# Patient Record
Sex: Female | Born: 1955 | Race: White | Hispanic: No
Health system: Southern US, Community
[De-identification: ages and names within clinical notes are randomized; demographics above are authoritative.]

---

## 2020-09-22 ENCOUNTER — Inpatient Hospital Stay
Admission: EM | Admit: 2020-09-22 | Discharge: 2020-11-06 | Disposition: E | Payer: Medicare Other | Source: Other Acute Inpatient Hospital | Attending: Internal Medicine | Admitting: Internal Medicine

## 2020-09-22 ENCOUNTER — Ambulatory Visit (HOSPITAL_COMMUNITY)
Admission: AD | Admit: 2020-09-22 | Discharge: 2020-09-22 | Disposition: A | Discharge status: Home / Self Care | Payer: Medicare Other | Source: Other Acute Inpatient Hospital | Attending: Internal Medicine | Admitting: Internal Medicine

## 2020-09-22 DIAGNOSIS — R0603 Acute respiratory distress: Secondary | ICD-10-CM

## 2020-09-22 DIAGNOSIS — K5641 Fecal impaction: Secondary | ICD-10-CM

## 2020-09-22 DIAGNOSIS — J690 Pneumonitis due to inhalation of food and vomit: Secondary | ICD-10-CM

## 2020-09-22 DIAGNOSIS — J9621 Acute and chronic respiratory failure with hypoxia: Secondary | ICD-10-CM | POA: Insufficient documentation

## 2020-09-22 DIAGNOSIS — Z4659 Encounter for fitting and adjustment of other gastrointestinal appliance and device: Secondary | ICD-10-CM

## 2020-09-22 DIAGNOSIS — J189 Pneumonia, unspecified organism: Secondary | ICD-10-CM | POA: Insufficient documentation

## 2020-09-22 DIAGNOSIS — K567 Ileus, unspecified: Secondary | ICD-10-CM

## 2020-09-22 DIAGNOSIS — J449 Chronic obstructive pulmonary disease, unspecified: Secondary | ICD-10-CM

## 2020-09-22 DIAGNOSIS — U071 COVID-19: Secondary | ICD-10-CM

## 2020-09-22 DIAGNOSIS — G4733 Obstructive sleep apnea (adult) (pediatric): Secondary | ICD-10-CM

## 2020-09-22 DIAGNOSIS — I482 Chronic atrial fibrillation, unspecified: Secondary | ICD-10-CM

## 2020-09-22 DIAGNOSIS — J90 Pleural effusion, not elsewhere classified: Secondary | ICD-10-CM

## 2020-09-22 DIAGNOSIS — K5900 Constipation, unspecified: Secondary | ICD-10-CM

## 2020-09-22 DIAGNOSIS — Z452 Encounter for adjustment and management of vascular access device: Secondary | ICD-10-CM

## 2020-09-22 LAB — BLOOD GAS, ARTERIAL
Acid-Base Excess: 0.2 mmol/L (ref 0.0–2.0)
Bicarbonate: 24.8 mmol/L (ref 20.0–28.0)
Drawn by: 164
FIO2: 75
O2 Saturation: 94.3 %
Patient temperature: 37
pCO2 arterial: 44.2 mmHg (ref 32.0–48.0)
pH, Arterial: 7.369 (ref 7.350–7.450)
pO2, Arterial: 76.3 mmHg — ABNORMAL LOW (ref 83.0–108.0)

## 2020-09-23 ENCOUNTER — Other Ambulatory Visit (HOSPITAL_COMMUNITY): Payer: Medicare Other

## 2020-09-23 LAB — CBC
HCT: 28.7 % — ABNORMAL LOW (ref 36.0–46.0)
Hemoglobin: 8 g/dL — ABNORMAL LOW (ref 12.0–15.0)
MCH: 24.5 pg — ABNORMAL LOW (ref 26.0–34.0)
MCHC: 27.9 g/dL — ABNORMAL LOW (ref 30.0–36.0)
MCV: 88 fL (ref 80.0–100.0)
Platelets: 177 10*3/uL (ref 150–400)
RBC: 3.26 MIL/uL — ABNORMAL LOW (ref 3.87–5.11)
RDW: 20.5 % — ABNORMAL HIGH (ref 11.5–15.5)
WBC: 17 10*3/uL — ABNORMAL HIGH (ref 4.0–10.5)
nRBC: 0.6 % — ABNORMAL HIGH (ref 0.0–0.2)

## 2020-09-23 LAB — COMPREHENSIVE METABOLIC PANEL
ALT: 11 U/L (ref 0–44)
AST: 14 U/L — ABNORMAL LOW (ref 15–41)
Albumin: 1.5 g/dL — ABNORMAL LOW (ref 3.5–5.0)
Alkaline Phosphatase: 66 U/L (ref 38–126)
Anion gap: 10 (ref 5–15)
BUN: 24 mg/dL — ABNORMAL HIGH (ref 8–23)
CO2: 25 mmol/L (ref 22–32)
Calcium: 7.8 mg/dL — ABNORMAL LOW (ref 8.9–10.3)
Chloride: 97 mmol/L — ABNORMAL LOW (ref 98–111)
Creatinine, Ser: 0.86 mg/dL (ref 0.44–1.00)
GFR, Estimated: >60 mL/min (ref >60)
Glucose, Bld: 115 mg/dL — ABNORMAL HIGH (ref 70–99)
Potassium: 3.6 mmol/L (ref 3.5–5.1)
Sodium: 132 mmol/L — ABNORMAL LOW (ref 135–145)
Total Bilirubin: 1 mg/dL (ref 0.3–1.2)
Total Protein: 4.4 g/dL — ABNORMAL LOW (ref 6.5–8.1)

## 2020-09-23 LAB — MAGNESIUM: Magnesium: 1.8 mg/dL (ref 1.7–2.4)

## 2020-09-23 LAB — PROTIME-INR
INR: 2 — ABNORMAL HIGH (ref 0.8–1.2)
Prothrombin Time: 23 seconds — ABNORMAL HIGH (ref 11.4–15.2)

## 2020-09-23 IMAGING — DX DG CHEST 1V PORT
1 series · 1 of 1 positions shown · non-contrast
Comparison: None.

CLINICAL DATA: Patient is suffering from pneumonia.

EXAM:
PORTABLE CHEST 1 VIEW

[chest]
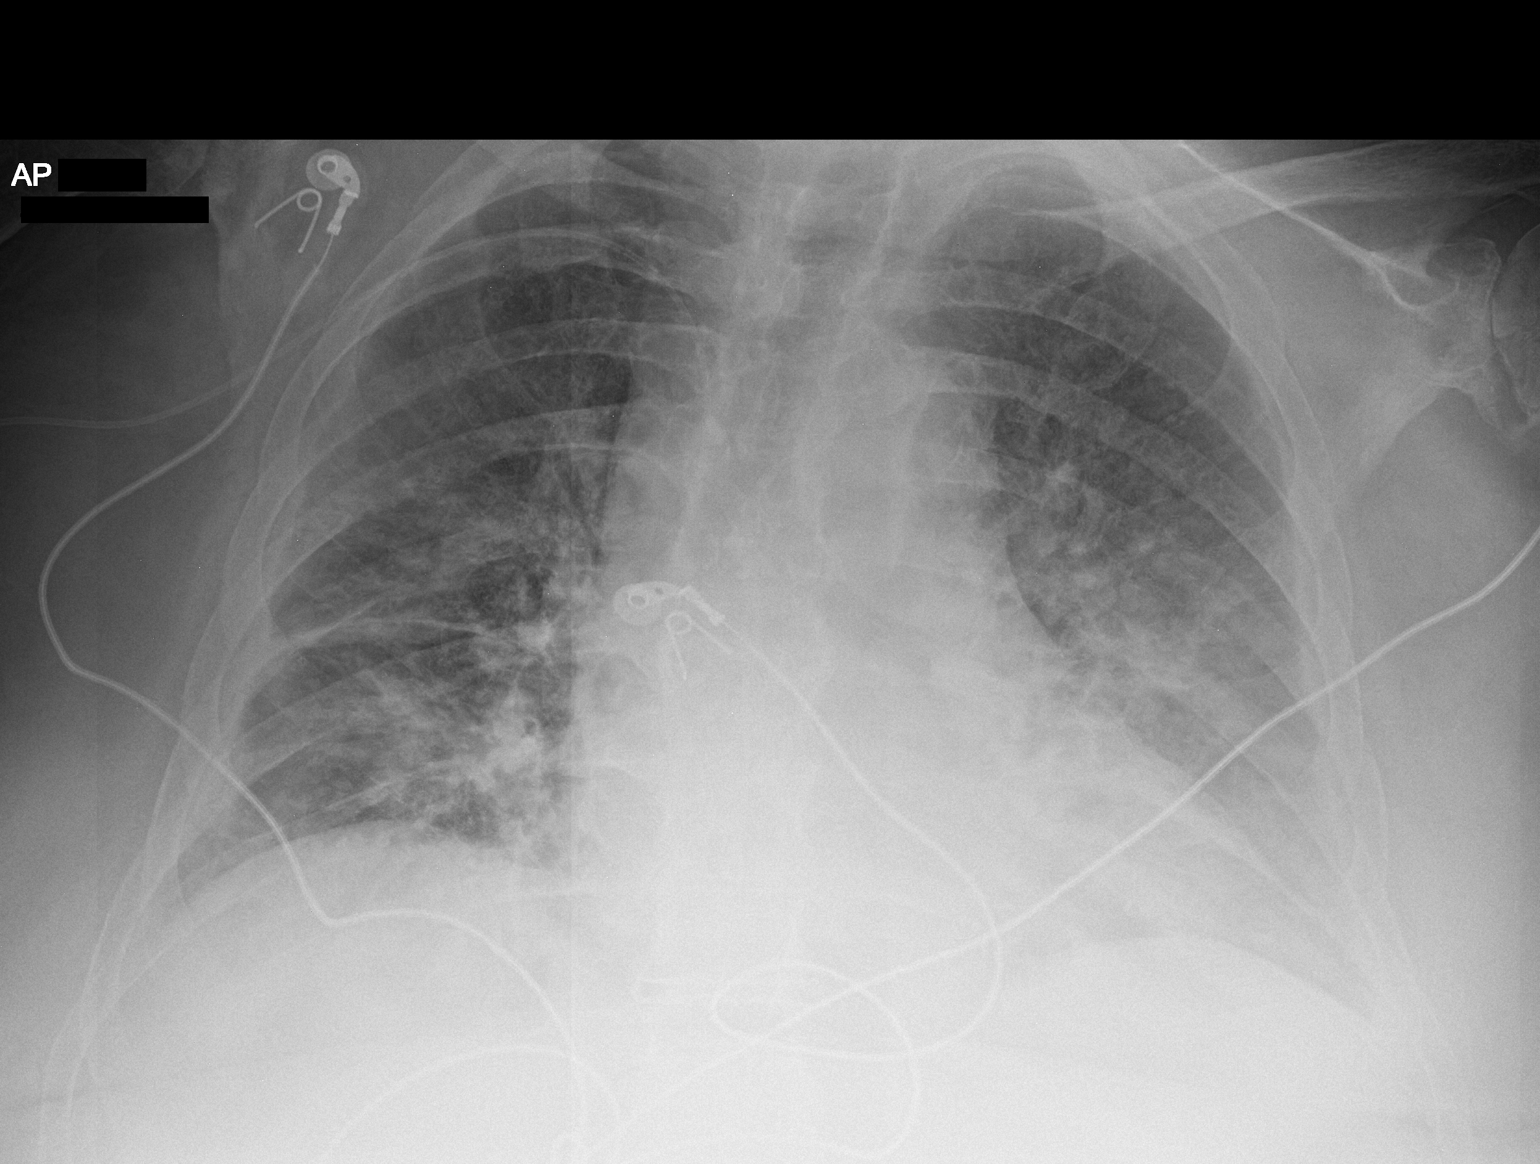

[1 of 1 positions shown; findings below may reference images not displayed]

FINDINGS: A right PICC line terminates in the SVC. Bilateral pulmonary
infiltrates suggestive of pneumonia given history. Probable mild
cardiomegaly. The hila and mediastinum are unremarkable. No other
acute abnormalities. No pneumothorax.
IMPRESSION: Bilateral pulmonary infiltrates most consistent with pneumonia given
history. Recommend short-term follow-up imaging to ensure
resolution.

## 2020-09-23 IMAGING — DX DG ABDOMEN 1V
1 series · 2 of 2 positions shown · non-contrast
Comparison: None

CLINICAL DATA: Evaluate for ileus

EXAM:
ABDOMEN - 1 VIEW

[Series 1: abdomen · 0.14mm/px · 2 of 2 slices shown]
[im 1/2]
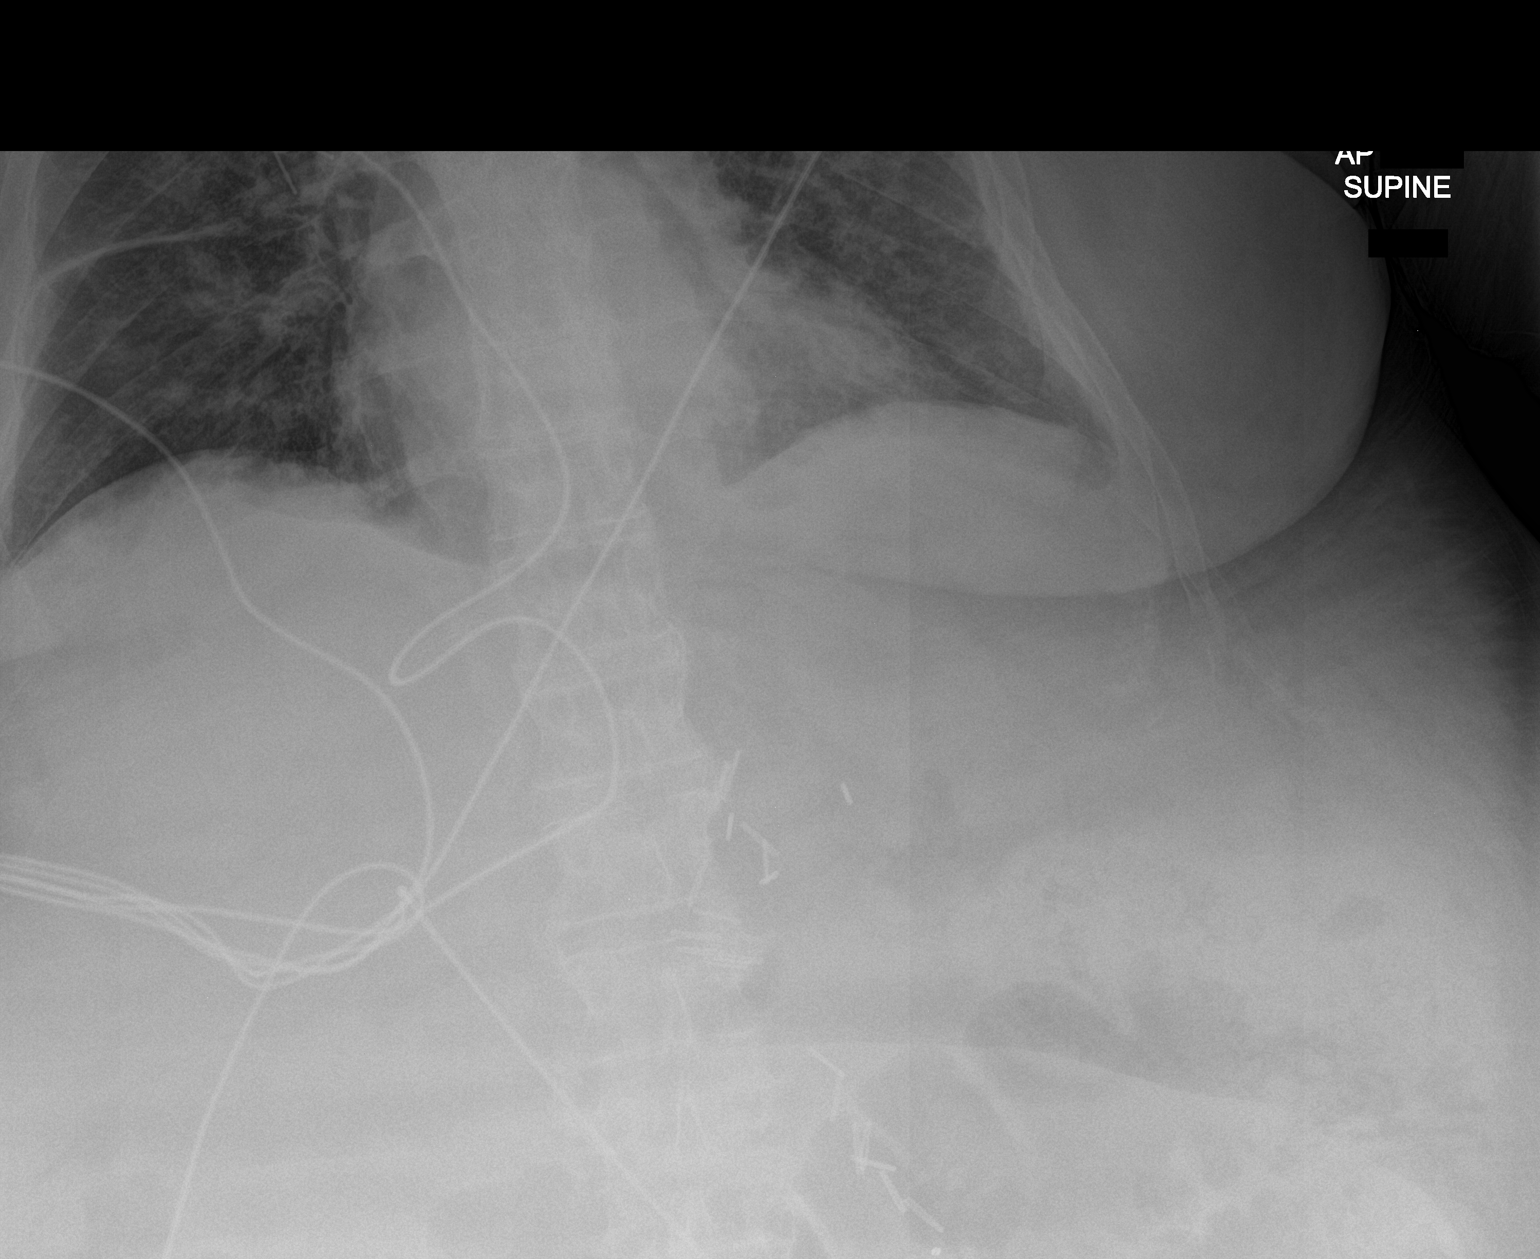
[im 2/2]
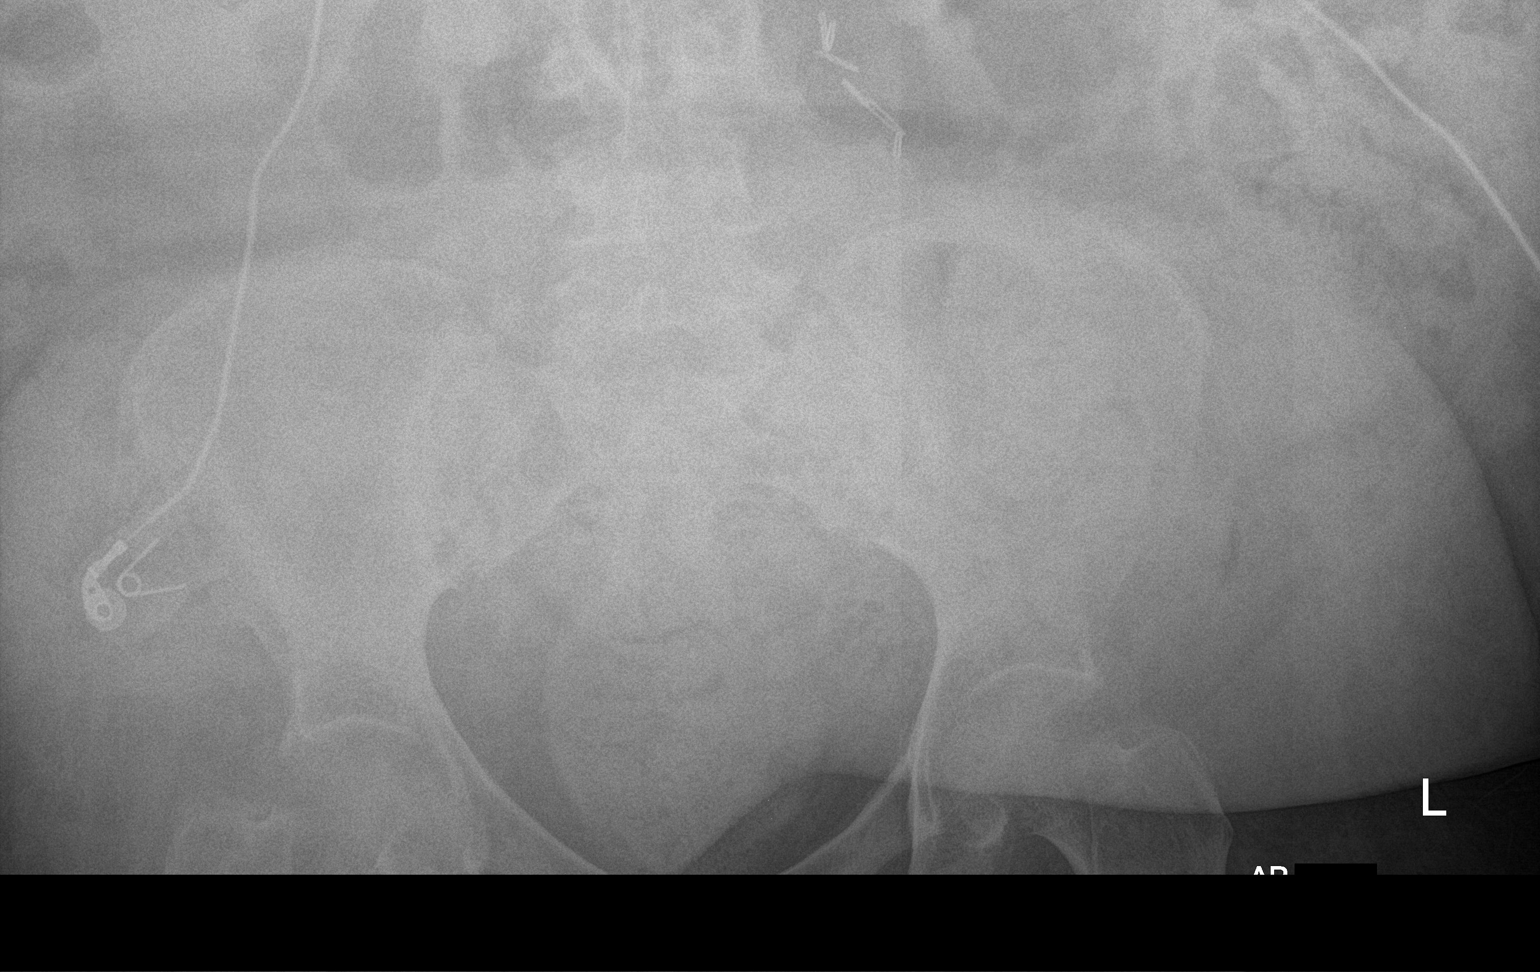

[2 of 2 positions shown; findings below may reference images not displayed]

FINDINGS: Mild fecal loading in the left colon. No evidence of ileus. No other
abnormalities in the abdomen.
IMPRESSION: Mild fecal loading.  No evidence of ileus or obstruction.

## 2020-09-24 DIAGNOSIS — U071 COVID-19: Secondary | ICD-10-CM | POA: Diagnosis not present

## 2020-09-24 DIAGNOSIS — G4733 Obstructive sleep apnea (adult) (pediatric): Secondary | ICD-10-CM

## 2020-09-24 DIAGNOSIS — I482 Chronic atrial fibrillation, unspecified: Secondary | ICD-10-CM

## 2020-09-24 DIAGNOSIS — J9621 Acute and chronic respiratory failure with hypoxia: Secondary | ICD-10-CM

## 2020-09-24 DIAGNOSIS — J449 Chronic obstructive pulmonary disease, unspecified: Secondary | ICD-10-CM

## 2020-09-24 LAB — BLOOD GAS, ARTERIAL
Acid-base deficit: 0.8 mmol/L (ref 0.0–2.0)
Bicarbonate: 23.5 mmol/L (ref 20.0–28.0)
FIO2: 100
O2 Saturation: 96.5 %
Patient temperature: 37
pCO2 arterial: 40.1 mmHg (ref 32.0–48.0)
pH, Arterial: 7.386 (ref 7.350–7.450)
pO2, Arterial: 183 mmHg — ABNORMAL HIGH (ref 83.0–108.0)

## 2020-09-24 LAB — URINE CULTURE: Culture: NO GROWTH

## 2020-09-24 LAB — URINALYSIS, ROUTINE W REFLEX MICROSCOPIC
Glucose, UA: NEGATIVE mg/dL
Hgb urine dipstick: NEGATIVE
Ketones, ur: NEGATIVE mg/dL
Leukocytes,Ua: NEGATIVE
Nitrite: NEGATIVE
Protein, ur: NEGATIVE mg/dL
Specific Gravity, Urine: 1.01 (ref 1.005–1.030)
pH: 5.5 (ref 5.0–8.0)

## 2020-09-24 LAB — CBC
HCT: 30 % — ABNORMAL LOW (ref 36.0–46.0)
Hemoglobin: 8.4 g/dL — ABNORMAL LOW (ref 12.0–15.0)
MCH: 24.6 pg — ABNORMAL LOW (ref 26.0–34.0)
MCHC: 28 g/dL — ABNORMAL LOW (ref 30.0–36.0)
MCV: 88 fL (ref 80.0–100.0)
Platelets: 195 10*3/uL (ref 150–400)
RBC: 3.41 MIL/uL — ABNORMAL LOW (ref 3.87–5.11)
RDW: 20.9 % — ABNORMAL HIGH (ref 11.5–15.5)
WBC: 16.5 10*3/uL — ABNORMAL HIGH (ref 4.0–10.5)
nRBC: 0.7 % — ABNORMAL HIGH (ref 0.0–0.2)

## 2020-09-24 LAB — BASIC METABOLIC PANEL
Anion gap: 11 (ref 5–15)
BUN: 24 mg/dL — ABNORMAL HIGH (ref 8–23)
CO2: 23 mmol/L (ref 22–32)
Calcium: 7.7 mg/dL — ABNORMAL LOW (ref 8.9–10.3)
Chloride: 99 mmol/L (ref 98–111)
Creatinine, Ser: 0.74 mg/dL (ref 0.44–1.00)
GFR, Estimated: >60 mL/min (ref >60)
Glucose, Bld: 127 mg/dL — ABNORMAL HIGH (ref 70–99)
Potassium: 3.5 mmol/L (ref 3.5–5.1)
Sodium: 133 mmol/L — ABNORMAL LOW (ref 135–145)

## 2020-09-24 LAB — PHOSPHORUS: Phosphorus: 3.2 mg/dL (ref 2.5–4.6)

## 2020-09-24 LAB — TSH: TSH: 4.434 u[IU]/mL (ref 0.350–4.500)

## 2020-09-24 LAB — HEMOGLOBIN A1C
Hgb A1c MFr Bld: 6.5 % — ABNORMAL HIGH (ref 4.8–5.6)
Mean Plasma Glucose: 139.85 mg/dL

## 2020-09-24 LAB — MAGNESIUM: Magnesium: 1.5 mg/dL — ABNORMAL LOW (ref 1.7–2.4)

## 2020-09-24 NOTE — Consult Note (Signed)
Pulmonary Critical Care Medicine [facility] [place]  PULMONARY SERVICE  Date of Service: 09/24/2020  PULMONARY CRITICAL CARE CONSULT   Angelica Sweeney  QPY:[id number]  DOB: 11/11/1955   DOA: 10/05/2020  Referring Physician: Luna Kitchens, MD  HPI: Angelica Sweeney is a 65 y.o. female seen for follow up of Acute on Chronic Respiratory Failure.  Patient has multiple medical problems including chronic atrial fibrillation COPD CHF diabetes mellitus nephrectomy melanoma came into the hospital because of increasing shortness of breath.  At the transferring facility patient is presented with shortness of breath patient was found to have COVID-19 with diffuse disease.  Patient was given remdesivir as well as steroids with some improvement however oxygen requirements did not improve.  On presentation here patient was on high flow oxygen 15L still with significant changes on the x-rays.  Patient is here for further management weaning of oxygen.  Review of Systems:  ROS performed and is unremarkable other than noted above.  Past medical history: COPD Atrial fibrillation Morbid obesity CHF Diabetes Renal cell cancer   Past surgical history: Nephrectomy  Social history: Unknown tobacco alcohol drug abuse  Family History: Non-Contributory to the present illness  Not on File  Medications: Reviewed on Rounds  Physical Exam:  Vitals: Temperature is 98.5 pulse 81 respiratory rate is 28 blood pressure 147/58 saturations 95%  Ventilator Settings on OptiFlow 60 L  General: Comfortable at this time Eyes: Grossly normal lids, irises & conjunctiva ENT: grossly tongue is normal Neck: no obvious mass Cardiovascular: S1-S2 normal no gallop or rub Respiratory: Coarse rhonchi are noted bilaterally Abdomen: Morbidly obese soft Skin: no rash seen on limited exam Musculoskeletal: not rigid Psychiatric:unable to assess Neurologic: no seizure no involuntary movements         Labs  on Admission:  Basic Metabolic Panel: Recent Labs  Lab 09/23/20 0410 09/24/20 0734  NA 132* 133*  K 3.6 3.5  CL 97* 99  CO2 25 23  GLUCOSE 115* 127*  BUN 24* 24*  CREATININE 0.86 0.74  CALCIUM 7.8* 7.7*  MG 1.8 1.5*  PHOS  --  3.2    Recent Labs  Lab 09/21/2020 1805 09/24/20 1500  PHART 7.369 7.386  PCO2ART 44.2 40.1  PO2ART 76.3* 183*  HCO3 24.8 23.5  O2SAT 94.3 96.5    Liver Function Tests: Recent Labs  Lab 09/23/20 0410  AST 14*  ALT 11  ALKPHOS 66  BILITOT 1.0  PROT 4.4*  ALBUMIN 1.5*   No results for input(s): LIPASE, AMYLASE in the last 168 hours. No results for input(s): AMMONIA in the last 168 hours.  CBC: Recent Labs  Lab 09/23/20 0410 09/24/20 0734  WBC 17.0* 16.5*  HGB 8.0* 8.4*  HCT 28.7* 30.0*  MCV 88.0 88.0  PLT 177 195    Cardiac Enzymes: No results for input(s): CKTOTAL, CKMB, CKMBINDEX, TROPONINI in the last 168 hours.  BNP (last 3 results) No results for input(s): BNP in the last 8760 hours.  ProBNP (last 3 results) No results for input(s): PROBNP in the last 8760 hours.   Radiological Exams on Admission: DG Abd 1 View  Result Date: 09/23/2020 CLINICAL DATA:  Evaluate for ileus EXAM: ABDOMEN - 1 VIEW COMPARISON:  None FINDINGS: Mild fecal loading in the left colon. No evidence of ileus. No other abnormalities in the abdomen. IMPRESSION: Mild fecal loading.  No evidence of ileus or obstruction. Electronically Signed   By: Gerome Sam III M.D.   On: 09/23/2020 11:58   DG Chest [facility]  1 View  Result Date: 09/23/2020 CLINICAL DATA:  Patient is suffering from pneumonia. EXAM: PORTABLE CHEST 1 VIEW COMPARISON:  None. FINDINGS: A right PICC line terminates in the SVC. Bilateral pulmonary infiltrates suggestive of pneumonia given history. Probable mild cardiomegaly. The hila and mediastinum are unremarkable. No other acute abnormalities. No pneumothorax. IMPRESSION: Bilateral pulmonary infiltrates most consistent with pneumonia given  history. Recommend short-term follow-up imaging to ensure resolution. Electronically Signed   By: Gerome Sam III M.D.   On: 09/23/2020 11:57    Assessment/Plan Active Problems:   Acute on chronic respiratory failure with hypoxia (HCC)   COPD, severe (HCC)   COVID-19 virus infection   Atrial fibrillation, chronic (HCC)   Obstructive sleep apnea   Acute on chronic respiratory failure with hypoxia patient is going to be continued on OptiFlow we will try to wean FiO2 down as tolerated continue with secretion management pulmonary toilet. COVID-19 virus infection in recovery phase we will continue to monitor along closely.  Chest x-ray still showing pulmonary infiltrates and patient still remains significantly hypoxic Chronic atrial fibrillation rate now rate is controlled we will continue with supportive care Severe COPD we will continue with medical management nebulizers as deemed necessary Obstructive sleep apnea patient stated that she had OSA but has not been compliant with CPAP and she states she does not have a machine  I have personally seen and evaluated the patient, evaluated laboratory and imaging results, formulated the assessment and plan and placed orders. The Patient requires high complexity decision making with multiple systems involvement.  Case was discussed on Rounds with the Respiratory Therapy Director and the Respiratory staff Time Spent  Yevonne Pax, MD [facility] Pulmonary Critical Care Medicine Sleep Medicine

## 2020-09-25 DIAGNOSIS — J449 Chronic obstructive pulmonary disease, unspecified: Secondary | ICD-10-CM | POA: Diagnosis not present

## 2020-09-25 DIAGNOSIS — J9621 Acute and chronic respiratory failure with hypoxia: Secondary | ICD-10-CM | POA: Diagnosis not present

## 2020-09-25 DIAGNOSIS — I482 Chronic atrial fibrillation, unspecified: Secondary | ICD-10-CM | POA: Diagnosis not present

## 2020-09-25 DIAGNOSIS — U071 COVID-19: Secondary | ICD-10-CM

## 2020-09-25 DIAGNOSIS — G4733 Obstructive sleep apnea (adult) (pediatric): Secondary | ICD-10-CM

## 2020-09-25 LAB — VANCOMYCIN, TROUGH
Vancomycin Tr: 33 ug/mL (ref 15–20)
Vancomycin Tr: 39 ug/mL (ref 15–20)

## 2020-09-25 LAB — MAGNESIUM: Magnesium: 2 mg/dL (ref 1.7–2.4)

## 2020-09-25 NOTE — Progress Notes (Signed)
Chart opened in error

## 2020-09-25 NOTE — Progress Notes (Signed)
Pulmonary Critical Care Medicine [facility] [place]   PULMONARY CRITICAL CARE SERVICE  PROGRESS NOTE     Angelica Sweeney  RKY:[id number]  DOB: 02/04/1955   DOA: 09/11/2020  Referring Physician: Luna Kitchens, MD  HPI: Angelica Sweeney is a 65 y.o. female being followed for ventilator/airway/oxygen weaning Acute on Chronic Respiratory Failure.  At this time patient is on 40 L high flow with 45% FiO2 he was refusing the BiPAP which I expected she would however once again she has been encouraged to try to be more compliant with recommended therapies.  Medications: Reviewed on Rounds  Physical Exam:  Vitals: Temperature is 98.3 pulse 93 respiratory rate was 28 blood pressure is 127/65 saturations 100%  Ventilator Settings patient remains on high flow oxygen 40 L with 45% FiO2  General: Comfortable at this time Neck: supple Cardiovascular: no malignant arrhythmias Respiratory: Coarse rhonchi expansion is equal Skin: no rash seen on limited exam Musculoskeletal: No gross abnormality Psychiatric:unable to assess Neurologic:no involuntary movements         Lab Data:   Basic Metabolic Panel: Recent Labs  Lab 09/23/20 0410 09/24/20 0734 09/25/20 0015  NA 132* 133*  --   K 3.6 3.5  --   CL 97* 99  --   CO2 25 23  --   GLUCOSE 115* 127*  --   BUN 24* 24*  --   CREATININE 0.86 0.74  --   CALCIUM 7.8* 7.7*  --   MG 1.8 1.5* 2.0  PHOS  --  3.2  --     ABG: Recent Labs  Lab 09/21/2020 1805 09/24/20 1500  PHART 7.369 7.386  PCO2ART 44.2 40.1  PO2ART 76.3* 183*  HCO3 24.8 23.5  O2SAT 94.3 96.5    Liver Function Tests: Recent Labs  Lab 09/23/20 0410  AST 14*  ALT 11  ALKPHOS 66  BILITOT 1.0  PROT 4.4*  ALBUMIN 1.5*   No results for input(s): LIPASE, AMYLASE in the last 168 hours. No results for input(s): AMMONIA in the last 168 hours.  CBC: Recent Labs  Lab 09/23/20 0410 09/24/20 0734  WBC 17.0* 16.5*  HGB 8.0* 8.4*  HCT 28.7* 30.0*  MCV  88.0 88.0  PLT 177 195    Cardiac Enzymes: No results for input(s): CKTOTAL, CKMB, CKMBINDEX, TROPONINI in the last 168 hours.  BNP (last 3 results) No results for input(s): BNP in the last 8760 hours.  ProBNP (last 3 results) No results for input(s): PROBNP in the last 8760 hours.  Radiological Exams: DG Abd 1 View  Result Date: 09/23/2020 CLINICAL DATA:  Evaluate for ileus EXAM: ABDOMEN - 1 VIEW COMPARISON:  None FINDINGS: Mild fecal loading in the left colon. No evidence of ileus. No other abnormalities in the abdomen. IMPRESSION: Mild fecal loading.  No evidence of ileus or obstruction. Electronically Signed   By: Gerome Sam III M.D.   On: 09/23/2020 11:58   DG Chest Port 1 View  Result Date: 09/23/2020 CLINICAL DATA:  Patient is suffering from pneumonia. EXAM: PORTABLE CHEST 1 VIEW COMPARISON:  None. FINDINGS: A right PICC line terminates in the SVC. Bilateral pulmonary infiltrates suggestive of pneumonia given history. Probable mild cardiomegaly. The hila and mediastinum are unremarkable. No other acute abnormalities. No pneumothorax. IMPRESSION: Bilateral pulmonary infiltrates most consistent with pneumonia given history. Recommend short-term follow-up imaging to ensure resolution. Electronically Signed   By: Gerome Sam III M.D.   On: 09/23/2020 11:57    Assessment/Plan Active Problems:   Acute on  chronic respiratory failure with hypoxia (HCC)   COPD, severe (HCC)   COVID-19 virus infection   Atrial fibrillation, chronic (HCC)   Obstructive sleep apnea   Acute on chronic respiratory failure hypoxia patient will be continued on heated high flow oxygen and titrate as tolerated.  Once again encouraged her to be compliant with the BiPAP in order for [place] to prevent potential intubation Severe COPD medical management nebulizers as needed COVID-19 virus infection in recovery prognosis remains guarded Chronic atrial fibrillation rate is controlled at this time Obstructive  sleep apnea BiPAP as ordered   I have personally seen and evaluated the patient, evaluated laboratory and imaging results, formulated the assessment and plan and placed orders. The Patient requires high complexity decision making with multiple systems involvement.  Rounds were done with the Respiratory Therapy Director and Staff therapists and discussed with nursing staff also.  Yevonne Pax, MD [facility] Pulmonary Critical Care Medicine Sleep Medicine

## 2020-09-26 DIAGNOSIS — J9621 Acute and chronic respiratory failure with hypoxia: Secondary | ICD-10-CM | POA: Diagnosis not present

## 2020-09-26 DIAGNOSIS — J449 Chronic obstructive pulmonary disease, unspecified: Secondary | ICD-10-CM | POA: Diagnosis not present

## 2020-09-26 DIAGNOSIS — I482 Chronic atrial fibrillation, unspecified: Secondary | ICD-10-CM | POA: Diagnosis not present

## 2020-09-26 DIAGNOSIS — U071 COVID-19: Secondary | ICD-10-CM | POA: Diagnosis not present

## 2020-09-26 LAB — CREATININE, SERUM
Creatinine, Ser: 0.78 mg/dL (ref 0.44–1.00)
GFR, Estimated: >60 mL/min (ref >60)

## 2020-09-26 NOTE — Progress Notes (Signed)
Pulmonary Critical Care Medicine [facility] [place]   PULMONARY CRITICAL CARE SERVICE  PROGRESS NOTE     Ruchama Kubicek  GUY:[id number]  DOB: [birth year]   DOA: 09/25/2020  Referring Physician: Luna Kitchens, MD  HPI: Angelica Sweeney is a 65 y.o. female being followed for ventilator/airway/oxygen weaning Acute on Chronic Respiratory Failure.  Patient is resting comfortably right now without distress at this time is been on 50 L high flow with 55% FiO2 saturations are good  Medications: Reviewed on Rounds  Physical Exam:  Vitals: Temperature is 98.1 pulse 96 respiratory 20 blood pressure is 109/40 saturations 94%  Ventilator Settings high flow oxygen  General: Comfortable at this time Neck: supple Cardiovascular: no malignant arrhythmias Respiratory: Coarse rhonchi expansion is equal Skin: no rash seen on limited exam Musculoskeletal: No gross abnormality Psychiatric:unable to assess Neurologic:no involuntary movements         Lab Data:   Basic Metabolic Panel: Recent Labs  Lab 09/23/20 0410 09/24/20 0734 09/25/20 0015  NA 132* 133*  --   K 3.6 3.5  --   CL 97* 99  --   CO2 25 23  --   GLUCOSE 115* 127*  --   BUN 24* 24*  --   CREATININE 0.86 0.74  --   CALCIUM 7.8* 7.7*  --   MG 1.8 1.5* 2.0  PHOS  --  3.2  --     ABG: Recent Labs  Lab 09/07/2020 1805 09/24/20 1500  PHART 7.369 7.386  PCO2ART 44.2 40.1  PO2ART 76.3* 183*  HCO3 24.8 23.5  O2SAT 94.3 96.5    Liver Function Tests: Recent Labs  Lab 09/23/20 0410  AST 14*  ALT 11  ALKPHOS 66  BILITOT 1.0  PROT 4.4*  ALBUMIN 1.5*   No results for input(s): LIPASE, AMYLASE in the last 168 hours. No results for input(s): AMMONIA in the last 168 hours.  CBC: Recent Labs  Lab 09/23/20 0410 09/24/20 0734  WBC 17.0* 16.5*  HGB 8.0* 8.4*  HCT 28.7* 30.0*  MCV 88.0 88.0  PLT 177 195    Cardiac Enzymes: No results for input(s): CKTOTAL, CKMB, CKMBINDEX, TROPONINI in the last 168  hours.  BNP (last 3 results) No results for input(s): BNP in the last 8760 hours.  ProBNP (last 3 results) No results for input(s): PROBNP in the last 8760 hours.  Radiological Exams: No results found.  Assessment/Plan Active Problems:   Acute on chronic respiratory failure with hypoxia (HCC)   COPD, severe (HCC)   COVID-19 virus infection   Atrial fibrillation, chronic (HCC)   Obstructive sleep apnea   Acute on chronic respiratory failure with hypoxia plan is to continue to try to titrate oxygen down as tolerated.  Continue secretion management pulmonary toilet. Severe COPD medical management we will continue to follow along closely COVID-19 virus infection in recovery phase Chronic atrial fibrillation rate now rate is controlled Obstructive sleep apnea patient is at baseline we will continue with present therapy and supportive care   I have personally seen and evaluated the patient, evaluated laboratory and imaging results, formulated the assessment and plan and placed orders. The Patient requires high complexity decision making with multiple systems involvement.  Rounds were done with the Respiratory Therapy Director and Staff therapists and discussed with nursing staff also.  Yevonne Pax, MD [facility] Pulmonary Critical Care Medicine Sleep Medicine

## 2020-09-27 DIAGNOSIS — J449 Chronic obstructive pulmonary disease, unspecified: Secondary | ICD-10-CM | POA: Diagnosis not present

## 2020-09-27 DIAGNOSIS — I482 Chronic atrial fibrillation, unspecified: Secondary | ICD-10-CM | POA: Diagnosis not present

## 2020-09-27 DIAGNOSIS — J9621 Acute and chronic respiratory failure with hypoxia: Secondary | ICD-10-CM | POA: Diagnosis not present

## 2020-09-27 DIAGNOSIS — U071 COVID-19: Secondary | ICD-10-CM | POA: Diagnosis not present

## 2020-09-27 LAB — RENAL FUNCTION PANEL
Albumin: 1.4 g/dL — ABNORMAL LOW (ref 3.5–5.0)
Anion gap: 8 (ref 5–15)
BUN: 20 mg/dL (ref 8–23)
CO2: 27 mmol/L (ref 22–32)
Calcium: 7.6 mg/dL — ABNORMAL LOW (ref 8.9–10.3)
Chloride: 103 mmol/L (ref 98–111)
Creatinine, Ser: 0.75 mg/dL (ref 0.44–1.00)
GFR, Estimated: >60 mL/min (ref >60)
Glucose, Bld: 142 mg/dL — ABNORMAL HIGH (ref 70–99)
Phosphorus: 3.2 mg/dL (ref 2.5–4.6)
Potassium: 2.7 mmol/L — CL (ref 3.5–5.1)
Sodium: 138 mmol/L (ref 135–145)

## 2020-09-27 LAB — CBC
HCT: 29.3 % — ABNORMAL LOW (ref 36.0–46.0)
Hemoglobin: 8.2 g/dL — ABNORMAL LOW (ref 12.0–15.0)
MCH: 24.4 pg — ABNORMAL LOW (ref 26.0–34.0)
MCHC: 28 g/dL — ABNORMAL LOW (ref 30.0–36.0)
MCV: 87.2 fL (ref 80.0–100.0)
Platelets: 211 10*3/uL (ref 150–400)
RBC: 3.36 MIL/uL — ABNORMAL LOW (ref 3.87–5.11)
RDW: 20.4 % — ABNORMAL HIGH (ref 11.5–15.5)
WBC: 9.7 10*3/uL (ref 4.0–10.5)
nRBC: 1 % — ABNORMAL HIGH (ref 0.0–0.2)

## 2020-09-27 LAB — VANCOMYCIN, TROUGH: Vancomycin Tr: 19 ug/mL (ref 15–20)

## 2020-09-27 NOTE — Progress Notes (Signed)
Pulmonary Critical Care Medicine [facility] [place]   PULMONARY CRITICAL CARE SERVICE  PROGRESS NOTE     Angelica Sweeney  POE:[id number]  DOB: 1955/12/31   DOA: 09/07/2020  Referring Physician: Luna Kitchens, MD  HPI: Angelica Sweeney is a 65 y.o. female being followed for ventilator/airway/oxygen weaning Acute on Chronic Respiratory Failure.  Patient is resting comfortably no fevers are noted remains on high flow 50 L and 50% FiO2  Medications: Reviewed on Rounds  Physical Exam:  Vitals: Temperature is 98.7 pulse 90 respiratory 25 blood pressure is 126/44 saturations 98%  Ventilator Settings on 50 L high flow 50% FiO2  General: Comfortable at this time Neck: supple Cardiovascular: no malignant arrhythmias Respiratory: Scattered rhonchi expansion is equal Skin: no rash seen on limited exam Musculoskeletal: No gross abnormality Psychiatric:unable to assess Neurologic:no involuntary movements         Lab Data:   Basic Metabolic Panel: Recent Labs  Lab 09/23/20 0410 09/24/20 0734 09/25/20 0015 09/26/20 1230 09/27/20 0010  NA 132* 133*  --   --  138  K 3.6 3.5  --   --  2.7*  CL 97* 99  --   --  103  CO2 25 23  --   --  27  GLUCOSE 115* 127*  --   --  142*  BUN 24* 24*  --   --  20  CREATININE 0.86 0.74  --  0.78 0.75  CALCIUM 7.8* 7.7*  --   --  7.6*  MG 1.8 1.5* 2.0  --   --   PHOS  --  3.2  --   --  3.2    ABG: Recent Labs  Lab 09/09/2020 1805 09/24/20 1500  PHART 7.369 7.386  PCO2ART 44.2 40.1  PO2ART 76.3* 183*  HCO3 24.8 23.5  O2SAT 94.3 96.5    Liver Function Tests: Recent Labs  Lab 09/23/20 0410 09/27/20 0010  AST 14*  --   ALT 11  --   ALKPHOS 66  --   BILITOT 1.0  --   PROT 4.4*  --   ALBUMIN 1.5* 1.4*   No results for input(s): LIPASE, AMYLASE in the last 168 hours. No results for input(s): AMMONIA in the last 168 hours.  CBC: Recent Labs  Lab 09/23/20 0410 09/24/20 0734 09/27/20 0010  WBC 17.0* 16.5* 9.7  HGB  8.0* 8.4* 8.2*  HCT 28.7* 30.0* 29.3*  MCV 88.0 88.0 87.2  PLT 177 195 211    Cardiac Enzymes: No results for input(s): CKTOTAL, CKMB, CKMBINDEX, TROPONINI in the last 168 hours.  BNP (last 3 results) No results for input(s): BNP in the last 8760 hours.  ProBNP (last 3 results) No results for input(s): PROBNP in the last 8760 hours.  Radiological Exams: No results found.  Assessment/Plan Active Problems:   Acute on chronic respiratory failure with hypoxia (HCC)   COPD, severe (HCC)   COVID-19 virus infection   Atrial fibrillation, chronic (HCC)   Obstructive sleep apnea   Acute on chronic respiratory failure hypoxia remains on the heated high flow titrate as tolerated down Severe COPD medical management we will continue nebs as necessary COVID-19 virus infection recovery Chronic atrial fibrillation rate is controlled Obstructive sleep apnea patient encouraged to be compliant with the BiPAP   I have personally seen and evaluated the patient, evaluated laboratory and imaging results, formulated the assessment and plan and placed orders. The Patient requires high complexity decision making with multiple systems involvement.  Rounds were done with  the Respiratory Therapy Director and Staff therapists and discussed with nursing staff also.  Allyne Gee, MD [facility] Pulmonary Critical Care Medicine Sleep Medicine

## 2020-09-28 ENCOUNTER — Other Ambulatory Visit (HOSPITAL_COMMUNITY): Payer: Medicare Other

## 2020-09-28 DIAGNOSIS — U071 COVID-19: Secondary | ICD-10-CM | POA: Diagnosis not present

## 2020-09-28 DIAGNOSIS — J449 Chronic obstructive pulmonary disease, unspecified: Secondary | ICD-10-CM | POA: Diagnosis not present

## 2020-09-28 DIAGNOSIS — J9621 Acute and chronic respiratory failure with hypoxia: Secondary | ICD-10-CM | POA: Diagnosis not present

## 2020-09-28 DIAGNOSIS — I482 Chronic atrial fibrillation, unspecified: Secondary | ICD-10-CM | POA: Diagnosis not present

## 2020-09-28 LAB — CULTURE, RESPIRATORY W GRAM STAIN: Culture: NORMAL

## 2020-09-28 LAB — POTASSIUM: Potassium: 3.7 mmol/L (ref 3.5–5.1)

## 2020-09-28 IMAGING — DX DG CHEST 1V PORT
1 series · 1 of 1 positions shown · non-contrast
Comparison: [DATE]

CLINICAL DATA: Pneumonia.

EXAM:
PORTABLE CHEST 1 VIEW

[chest ap]
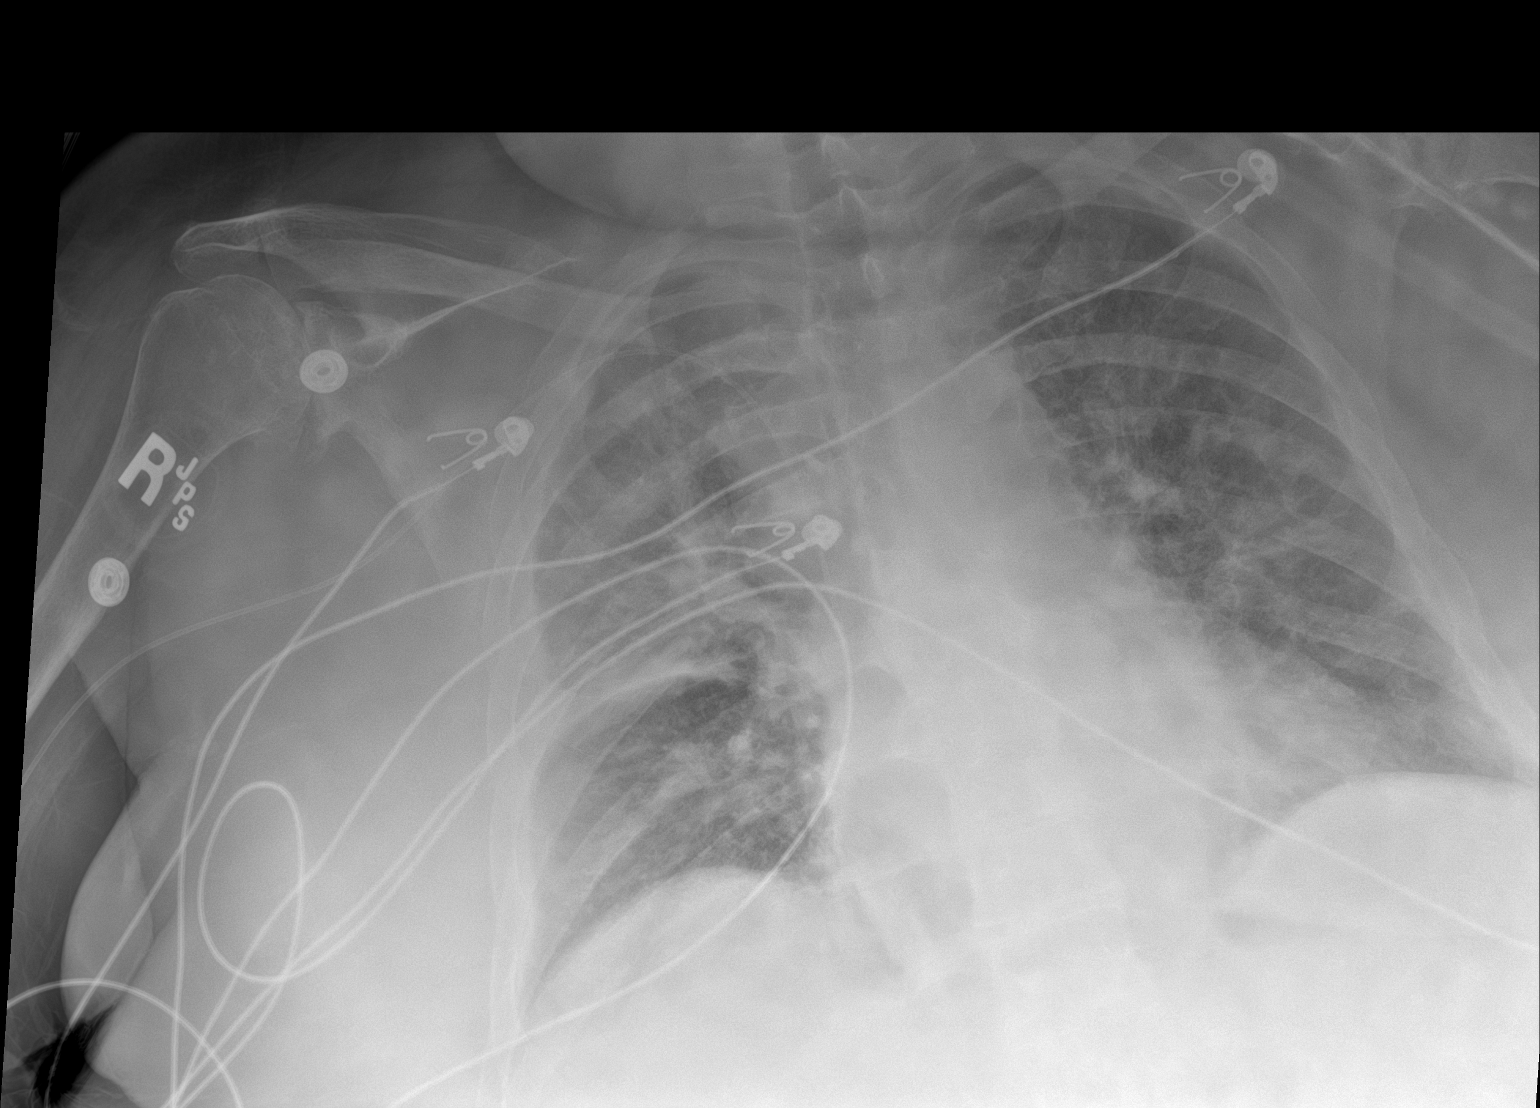

[1 of 1 positions shown; findings below may reference images not displayed]

FINDINGS: There is a right arm PICC line with tip at the cavoatrial junction.
Stable cardiomediastinal contours. Bilateral multifocal interstitial
and airspace densities are identified. Compared with the previous
exam there is been worsening hazy opacification within the right
upper lobe.
IMPRESSION: 1. Worsening aeration to the right upper lobe.

## 2020-09-28 NOTE — Progress Notes (Signed)
Pulmonary Critical Care Medicine [facility] [place]   PULMONARY CRITICAL CARE SERVICE  PROGRESS NOTE     Robinette Esters  IZT:[id number]  DOB: [birth year]   DOA: 10/14/20  Referring Physician: Luna Kitchens, MD  HPI: Caralyn Twining is a 65 y.o. female being followed for ventilator/airway/oxygen weaning Acute on Chronic Respiratory Failure.  Patient is comfortable right now without distress has been on 6 L oxygen using the BiPAP as ordered at nighttime  Medications: Reviewed on Rounds  Physical Exam:  Vitals: Temperature is 98.0 pulse 96 respiratory 19 blood pressure 139/58 saturations 97%  Ventilator Settings patient currently is on 6 L oxygen off the BiPAP  General: Comfortable at this time Neck: supple Cardiovascular: no malignant arrhythmias Respiratory: No rhonchi very coarse breath sounds Skin: no rash seen on limited exam Musculoskeletal: No gross abnormality Psychiatric:unable to assess Neurologic:no involuntary movements         Lab Data:   Basic Metabolic Panel: Recent Labs  Lab 09/23/20 0410 09/24/20 0734 09/25/20 0015 09/26/20 1230 09/27/20 0010 09/28/20 0358  NA 132* 133*  --   --  138  --   K 3.6 3.5  --   --  2.7* 3.7  CL 97* 99  --   --  103  --   CO2 25 23  --   --  27  --   GLUCOSE 115* 127*  --   --  142*  --   BUN 24* 24*  --   --  20  --   CREATININE 0.86 0.74  --  0.78 0.75  --   CALCIUM 7.8* 7.7*  --   --  7.6*  --   MG 1.8 1.5* 2.0  --   --   --   PHOS  --  3.2  --   --  3.2  --     ABG: Recent Labs  Lab October 14, 2020 1805 09/24/20 1500  PHART 7.369 7.386  PCO2ART 44.2 40.1  PO2ART 76.3* 183*  HCO3 24.8 23.5  O2SAT 94.3 96.5    Liver Function Tests: Recent Labs  Lab 09/23/20 0410 09/27/20 0010  AST 14*  --   ALT 11  --   ALKPHOS 66  --   BILITOT 1.0  --   PROT 4.4*  --   ALBUMIN 1.5* 1.4*   No results for input(s): LIPASE, AMYLASE in the last 168 hours. No results for input(s): AMMONIA in the last 168  hours.  CBC: Recent Labs  Lab 09/23/20 0410 09/24/20 0734 09/27/20 0010  WBC 17.0* 16.5* 9.7  HGB 8.0* 8.4* 8.2*  HCT 28.7* 30.0* 29.3*  MCV 88.0 88.0 87.2  PLT 177 195 211    Cardiac Enzymes: No results for input(s): CKTOTAL, CKMB, CKMBINDEX, TROPONINI in the last 168 hours.  BNP (last 3 results) No results for input(s): BNP in the last 8760 hours.  ProBNP (last 3 results) No results for input(s): PROBNP in the last 8760 hours.  Radiological Exams: DG CHEST PORT 1 VIEW  Result Date: 09/28/2020 CLINICAL DATA:  Pneumonia. EXAM: PORTABLE CHEST 1 VIEW COMPARISON:  09/23/2020 FINDINGS: There is a right arm PICC line with tip at the cavoatrial junction. Stable cardiomediastinal contours. Bilateral multifocal interstitial and airspace densities are identified. Compared with the previous exam there is been worsening hazy opacification within the right upper lobe. IMPRESSION: 1. Worsening aeration to the right upper lobe. Electronically Signed   By: Signa Kell M.D.   On: 09/28/2020 06:17    Assessment/Plan Active  Problems:   Acute on chronic respiratory failure with hypoxia (HCC)   COPD, severe (HCC)   COVID-19 virus infection   Atrial fibrillation, chronic (HCC)   Obstructive sleep apnea   Acute on chronic respiratory failure with hypoxia patient is going to continue with the BiPAP at nighttime using the oxygen only during the daytime Severe COPD medical management we will continue to follow along closely COVID-19 virus infection in recovery phase Obstructive sleep apnea nonissue at this time patient is using the BiPAP at night Chronic atrial fibrillation right now rate is controlled   I have personally seen and evaluated the patient, evaluated laboratory and imaging results, formulated the assessment and plan and placed orders. The Patient requires high complexity decision making with multiple systems involvement.  Rounds were done with the Respiratory Therapy Director  and Staff therapists and discussed with nursing staff also.  Yevonne Pax, MD [facility] Pulmonary Critical Care Medicine Sleep Medicine

## 2020-09-29 LAB — BASIC METABOLIC PANEL
Anion gap: 7 (ref 5–15)
BUN: 17 mg/dL (ref 8–23)
CO2: 28 mmol/L (ref 22–32)
Calcium: 7.8 mg/dL — ABNORMAL LOW (ref 8.9–10.3)
Chloride: 104 mmol/L (ref 98–111)
Creatinine, Ser: 0.79 mg/dL (ref 0.44–1.00)
GFR, Estimated: >60 mL/min (ref >60)
Glucose, Bld: 136 mg/dL — ABNORMAL HIGH (ref 70–99)
Potassium: 2.9 mmol/L — ABNORMAL LOW (ref 3.5–5.1)
Sodium: 139 mmol/L (ref 135–145)

## 2020-09-29 LAB — MAGNESIUM: Magnesium: 1.3 mg/dL — ABNORMAL LOW (ref 1.7–2.4)

## 2020-09-30 DIAGNOSIS — I482 Chronic atrial fibrillation, unspecified: Secondary | ICD-10-CM | POA: Diagnosis not present

## 2020-09-30 DIAGNOSIS — J449 Chronic obstructive pulmonary disease, unspecified: Secondary | ICD-10-CM | POA: Diagnosis not present

## 2020-09-30 DIAGNOSIS — U071 COVID-19: Secondary | ICD-10-CM | POA: Diagnosis not present

## 2020-09-30 DIAGNOSIS — J9621 Acute and chronic respiratory failure with hypoxia: Secondary | ICD-10-CM | POA: Diagnosis not present

## 2020-09-30 NOTE — Progress Notes (Signed)
Pulmonary Critical Care Medicine [facility] [place]   PULMONARY CRITICAL CARE SERVICE  PROGRESS NOTE     Angelica Sweeney  KKX:[id number]  DOB: 1955/10/19   DOA: 09/30/2020  Referring Physician: Luna Kitchens, MD  HPI: Angelica Sweeney is a 65 y.o. female being followed for ventilator/airway/oxygen weaning Acute on Chronic Respiratory Failure.  Resting comfortably right now without distress at this time no fevers are noted patient was on 6 L O2  Medications: Reviewed on Rounds  Physical Exam:  Vitals: Temperature is 97.7 pulse 77 respiratory rate is 20 blood pressure 100/74 saturations 94%  Ventilator Settings on 6 L of oxygen using BiPAP at nighttime  General: Comfortable at this time Neck: supple Cardiovascular: no malignant arrhythmias Respiratory: Scattered rhonchi expansion is equal Skin: no rash seen on limited exam Musculoskeletal: No gross abnormality Psychiatric:unable to assess Neurologic:no involuntary movements         Lab Data:   Basic Metabolic Panel: Recent Labs  Lab 09/24/20 0734 09/25/20 0015 09/26/20 1230 09/27/20 0010 09/28/20 0358 09/29/20 1815  NA 133*  --   --  138  --  139  K 3.5  --   --  2.7* 3.7 2.9*  CL 99  --   --  103  --  104  CO2 23  --   --  27  --  28  GLUCOSE 127*  --   --  142*  --  136*  BUN 24*  --   --  20  --  17  CREATININE 0.74  --  0.78 0.75  --  0.79  CALCIUM 7.7*  --   --  7.6*  --  7.8*  MG 1.5* 2.0  --   --   --  1.3*  PHOS 3.2  --   --  3.2  --   --     ABG: Recent Labs  Lab 09/24/20 1500  PHART 7.386  PCO2ART 40.1  PO2ART 183*  HCO3 23.5  O2SAT 96.5    Liver Function Tests: Recent Labs  Lab 09/27/20 0010  ALBUMIN 1.4*   No results for input(s): LIPASE, AMYLASE in the last 168 hours. No results for input(s): AMMONIA in the last 168 hours.  CBC: Recent Labs  Lab 09/24/20 0734 09/27/20 0010  WBC 16.5* 9.7  HGB 8.4* 8.2*  HCT 30.0* 29.3*  MCV 88.0 87.2  PLT 195 211    Cardiac  Enzymes: No results for input(s): CKTOTAL, CKMB, CKMBINDEX, TROPONINI in the last 168 hours.  BNP (last 3 results) No results for input(s): BNP in the last 8760 hours.  ProBNP (last 3 results) No results for input(s): PROBNP in the last 8760 hours.  Radiological Exams: No results found.  Assessment/Plan Active Problems:   Acute on chronic respiratory failure with hypoxia (HCC)   COPD, severe (HCC)   COVID-19 virus infection   Atrial fibrillation, chronic (HCC)   Obstructive sleep apnea   Acute on chronic respiratory failure with hypoxia we will continue with BiPAP at nighttime while asleep encourage compliance Severe COPD medical management we will continue to follow along COVID-19 virus infection in recovery phase Chronic atrial fibrillation rate now rate is controlled Obstructive sleep apnea encourage compliance with BiPAP   I have personally seen and evaluated the patient, evaluated laboratory and imaging results, formulated the assessment and plan and placed orders. The Patient requires high complexity decision making with multiple systems involvement.  Rounds were done with the Respiratory Therapy Director and Staff therapists and discussed with  nursing staff also.  Allyne Gee, MD [facility] Pulmonary Critical Care Medicine Sleep Medicine

## 2020-10-01 LAB — POTASSIUM: Potassium: 3.7 mmol/L (ref 3.5–5.1)

## 2020-10-01 LAB — MAGNESIUM: Magnesium: 1.9 mg/dL (ref 1.7–2.4)

## 2020-10-02 ENCOUNTER — Other Ambulatory Visit (HOSPITAL_COMMUNITY): Payer: Medicare Other

## 2020-10-02 LAB — BASIC METABOLIC PANEL
Anion gap: 7 (ref 5–15)
BUN: 16 mg/dL (ref 8–23)
CO2: 30 mmol/L (ref 22–32)
Calcium: 8 mg/dL — ABNORMAL LOW (ref 8.9–10.3)
Chloride: 101 mmol/L (ref 98–111)
Creatinine, Ser: 0.69 mg/dL (ref 0.44–1.00)
GFR, Estimated: >60 mL/min (ref >60)
Glucose, Bld: 120 mg/dL — ABNORMAL HIGH (ref 70–99)
Potassium: 3.6 mmol/L (ref 3.5–5.1)
Sodium: 138 mmol/L (ref 135–145)

## 2020-10-02 LAB — CBC
HCT: 31.8 % — ABNORMAL LOW (ref 36.0–46.0)
Hemoglobin: 8.6 g/dL — ABNORMAL LOW (ref 12.0–15.0)
MCH: 23.8 pg — ABNORMAL LOW (ref 26.0–34.0)
MCHC: 27 g/dL — ABNORMAL LOW (ref 30.0–36.0)
MCV: 88.1 fL (ref 80.0–100.0)
Platelets: 250 10*3/uL (ref 150–400)
RBC: 3.61 MIL/uL — ABNORMAL LOW (ref 3.87–5.11)
RDW: 20.6 % — ABNORMAL HIGH (ref 11.5–15.5)
WBC: 8.4 10*3/uL (ref 4.0–10.5)
nRBC: 0.5 % — ABNORMAL HIGH (ref 0.0–0.2)

## 2020-10-02 LAB — PHOSPHORUS: Phosphorus: 2.9 mg/dL (ref 2.5–4.6)

## 2020-10-02 LAB — MAGNESIUM: Magnesium: 1.8 mg/dL (ref 1.7–2.4)

## 2020-10-02 IMAGING — DX DG CHEST 1V PORT
1 series · 1 of 1 positions shown · non-contrast
Comparison: Four days ago

CLINICAL DATA: Pneumonia and pleural effusion

EXAM:
PORTABLE CHEST 1 VIEW

[chest]
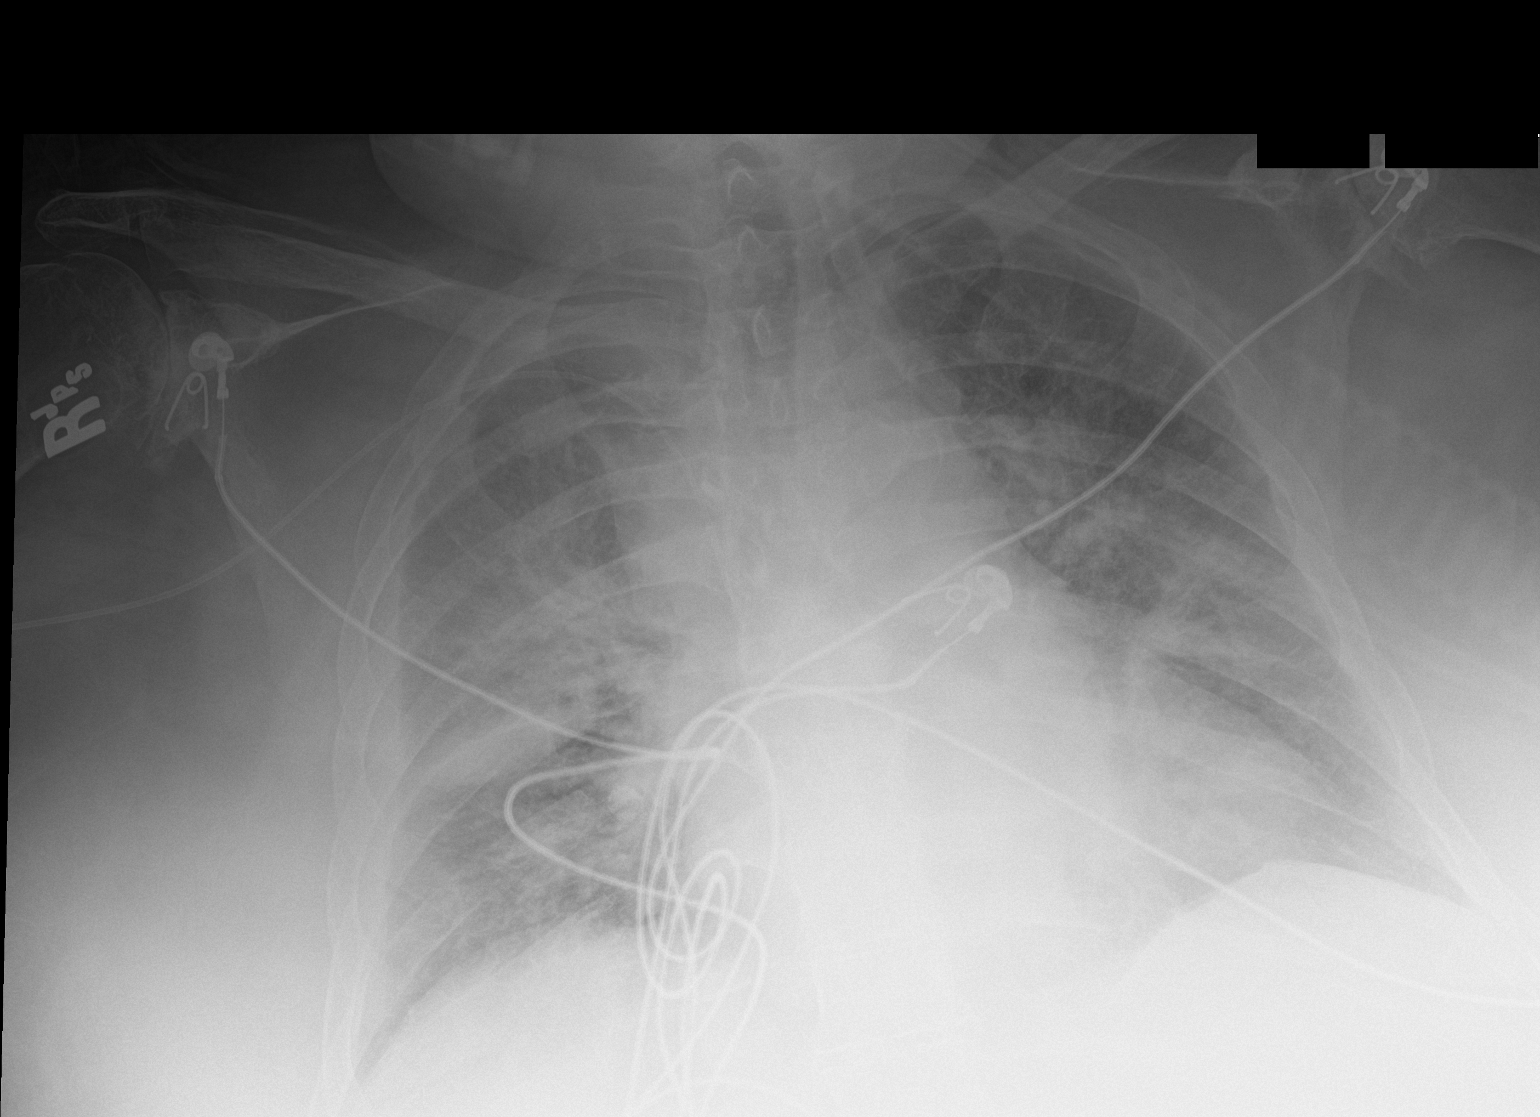

[1 of 1 positions shown; findings below may reference images not displayed]

FINDINGS: Right PICC with tip at the SVC.

Pulmonary opacity and layering right pleural fluid, unchanged.
Cardiomegaly and vascular pedicle widening. Artifact from EKG leads.
IMPRESSION: Unchanged diffuse pulmonary opacity with right-sided effusion.

## 2020-10-03 ENCOUNTER — Other Ambulatory Visit (HOSPITAL_COMMUNITY): Payer: Medicare Other

## 2020-10-03 LAB — BASIC METABOLIC PANEL
Anion gap: 6 (ref 5–15)
BUN: 17 mg/dL (ref 8–23)
CO2: 30 mmol/L (ref 22–32)
Calcium: 7.8 mg/dL — ABNORMAL LOW (ref 8.9–10.3)
Chloride: 102 mmol/L (ref 98–111)
Creatinine, Ser: 0.71 mg/dL (ref 0.44–1.00)
GFR, Estimated: >60 mL/min (ref >60)
Glucose, Bld: 115 mg/dL — ABNORMAL HIGH (ref 70–99)
Potassium: 3.6 mmol/L (ref 3.5–5.1)
Sodium: 138 mmol/L (ref 135–145)

## 2020-10-03 LAB — PHOSPHORUS: Phosphorus: 2.6 mg/dL (ref 2.5–4.6)

## 2020-10-03 LAB — MAGNESIUM: Magnesium: 1.6 mg/dL — ABNORMAL LOW (ref 1.7–2.4)

## 2020-10-04 ENCOUNTER — Other Ambulatory Visit (HOSPITAL_COMMUNITY): Payer: Medicare Other

## 2020-10-04 LAB — CBC
HCT: 30.2 % — ABNORMAL LOW (ref 36.0–46.0)
Hemoglobin: 7.9 g/dL — ABNORMAL LOW (ref 12.0–15.0)
MCH: 23.3 pg — ABNORMAL LOW (ref 26.0–34.0)
MCHC: 26.2 g/dL — ABNORMAL LOW (ref 30.0–36.0)
MCV: 89.1 fL (ref 80.0–100.0)
Platelets: 239 10*3/uL (ref 150–400)
RBC: 3.39 MIL/uL — ABNORMAL LOW (ref 3.87–5.11)
RDW: 20.1 % — ABNORMAL HIGH (ref 11.5–15.5)
WBC: 7.6 10*3/uL (ref 4.0–10.5)
nRBC: 0.3 % — ABNORMAL HIGH (ref 0.0–0.2)

## 2020-10-04 LAB — BASIC METABOLIC PANEL
Anion gap: 6 (ref 5–15)
BUN: 12 mg/dL (ref 8–23)
CO2: 32 mmol/L (ref 22–32)
Calcium: 7.8 mg/dL — ABNORMAL LOW (ref 8.9–10.3)
Chloride: 102 mmol/L (ref 98–111)
Creatinine, Ser: 0.61 mg/dL (ref 0.44–1.00)
GFR, Estimated: >60 mL/min (ref >60)
Glucose, Bld: 73 mg/dL (ref 70–99)
Potassium: 3.3 mmol/L — ABNORMAL LOW (ref 3.5–5.1)
Sodium: 140 mmol/L (ref 135–145)

## 2020-10-04 LAB — MAGNESIUM: Magnesium: 1.8 mg/dL (ref 1.7–2.4)

## 2020-10-04 LAB — PHOSPHORUS: Phosphorus: 3 mg/dL (ref 2.5–4.6)

## 2020-10-04 IMAGING — DX DG CHEST 1V PORT
1 series · 1 of 1 positions shown · non-contrast
Comparison: Portable chest [DATE] and earlier.

CLINICAL DATA: 64-year-old female with aspiration pneumonia.

EXAM:
PORTABLE CHEST 1 VIEW

[chest]
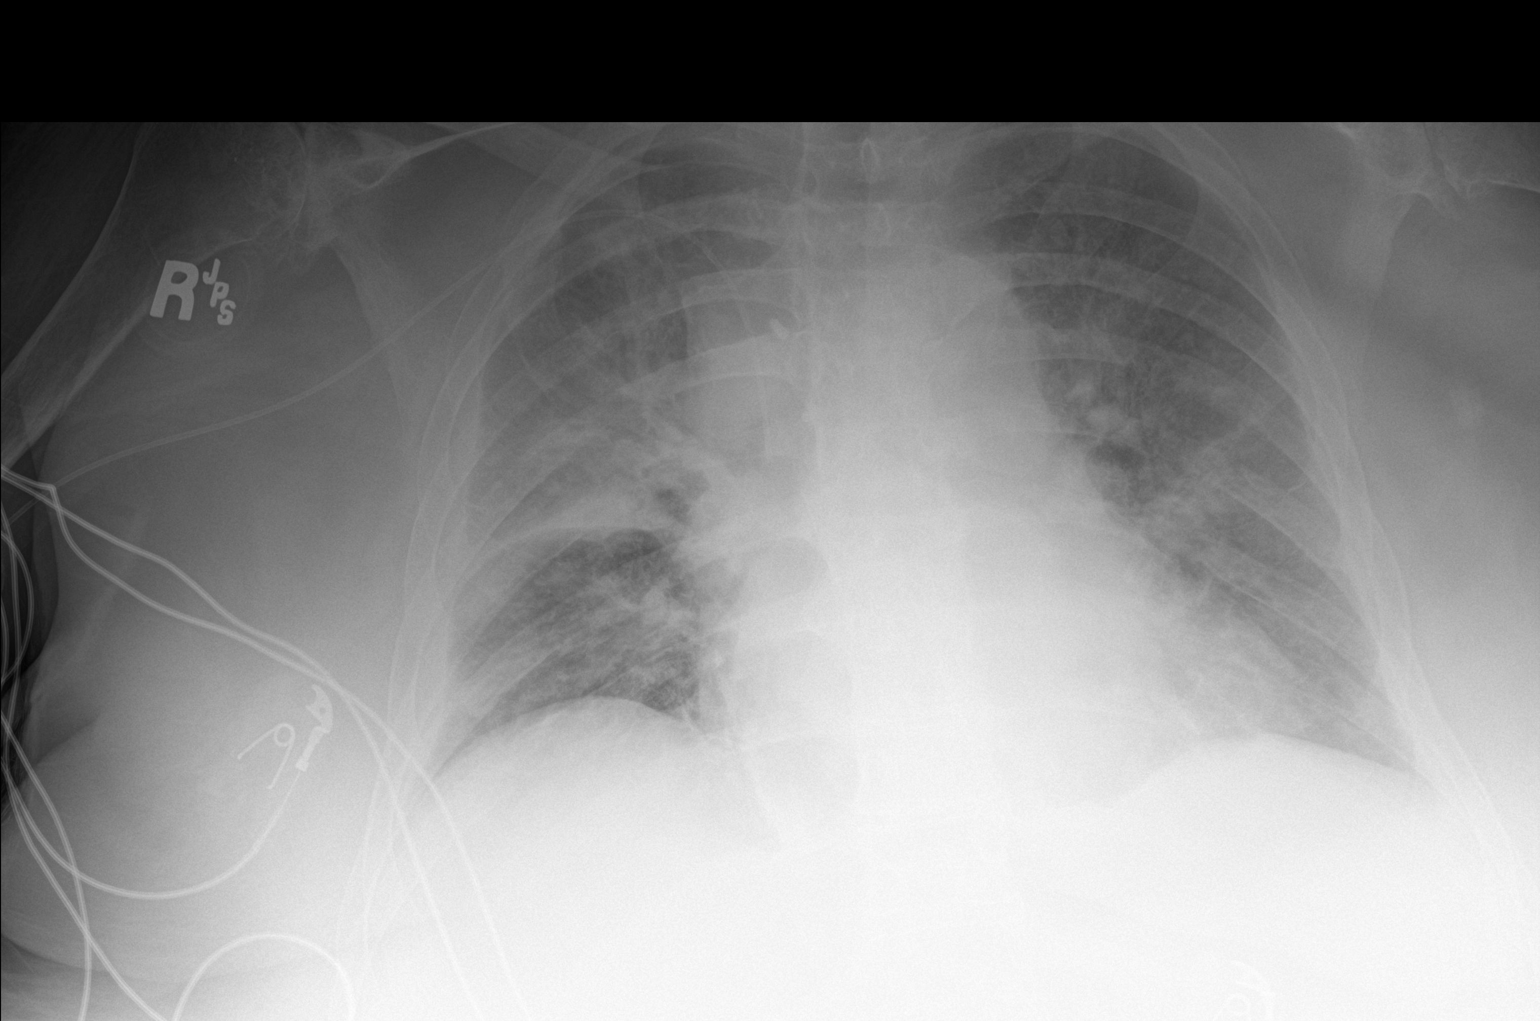

[1 of 1 positions shown; findings below may reference images not displayed]

FINDINGS: Portable AP semi upright view at [PU] hours. Stable right PICC line.
Stable somewhat low lung volumes. Stable cardiac size and
mediastinal contours. Visualized tracheal air column is within
normal limits. Patchy and confluent perihilar opacity greater on the
right. No superimposed pneumothorax. No pleural effusion is evident.
Pulmonary vascularity and ventilation appears stable. Stable
visualized osseous structures. Chronic severe glenohumeral
degeneration. Paucity of bowel gas in the upper abdomen.
IMPRESSION: Stable ventilation with patchy and confluent perihilar opacity
greater on the right.

## 2020-10-04 NOTE — Progress Notes (Signed)
Patient came to MRI for 2nd attempt to obtain MRI.  Patient given pain medication and patient could not lay on MRI exam table without moving and complaining about back pain, as she did on first attempt.  Unable to obtain imaging.

## 2020-10-05 ENCOUNTER — Other Ambulatory Visit (HOSPITAL_COMMUNITY): Payer: Medicare Other

## 2020-10-05 LAB — BLOOD GAS, ARTERIAL
Acid-Base Excess: 6.9 mmol/L — ABNORMAL HIGH (ref 0.0–2.0)
Bicarbonate: 31.2 mmol/L — ABNORMAL HIGH (ref 20.0–28.0)
FIO2: 40
O2 Saturation: 84.5 %
Patient temperature: 36.5
pCO2 arterial: 45.9 mmHg (ref 32.0–48.0)
pH, Arterial: 7.444 (ref 7.350–7.450)
pO2, Arterial: 54.2 mmHg — ABNORMAL LOW (ref 83.0–108.0)

## 2020-10-05 IMAGING — DX DG CHEST 1V PORT
2 series · 2 of 2 positions shown · non-contrast
Comparison: [DATE]

CLINICAL DATA: Acute respiratory distress

EXAM:
PORTABLE CHEST 1 VIEW

[chest ap (1 of 2)]
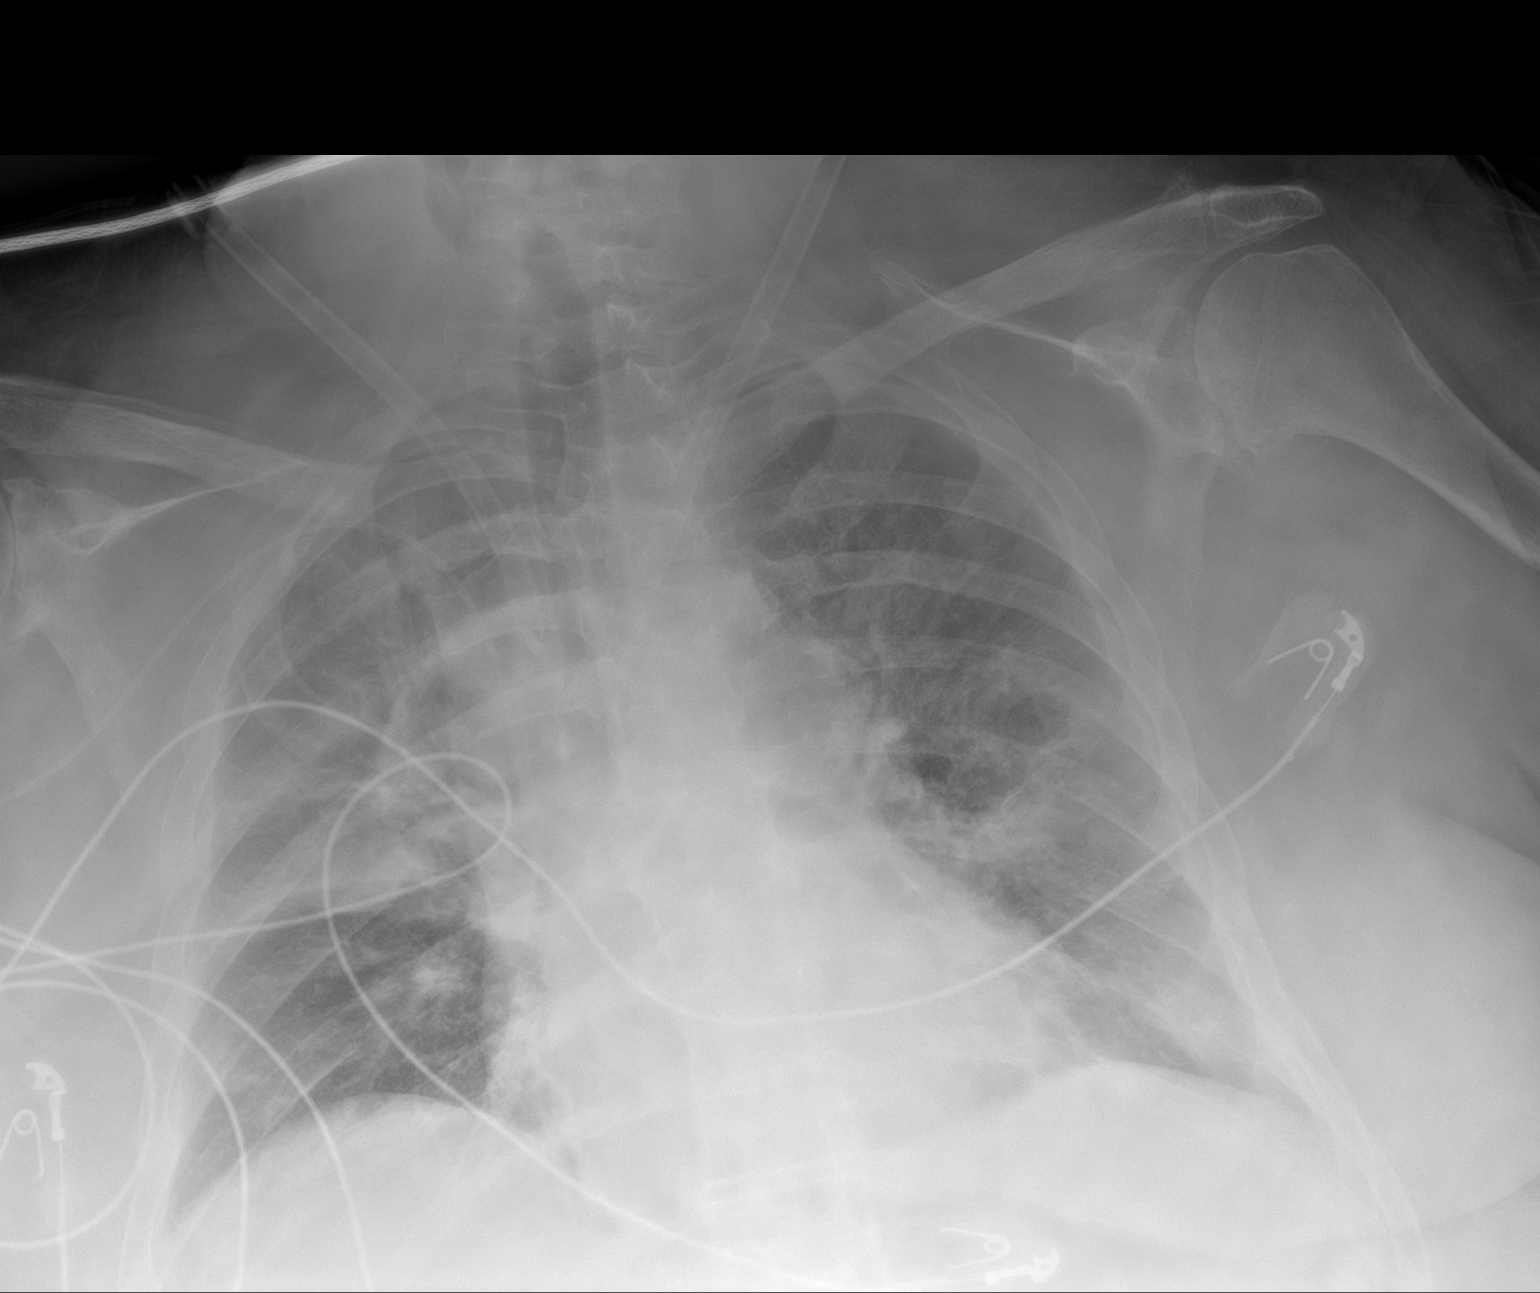

[chest ap (2 of 2)]
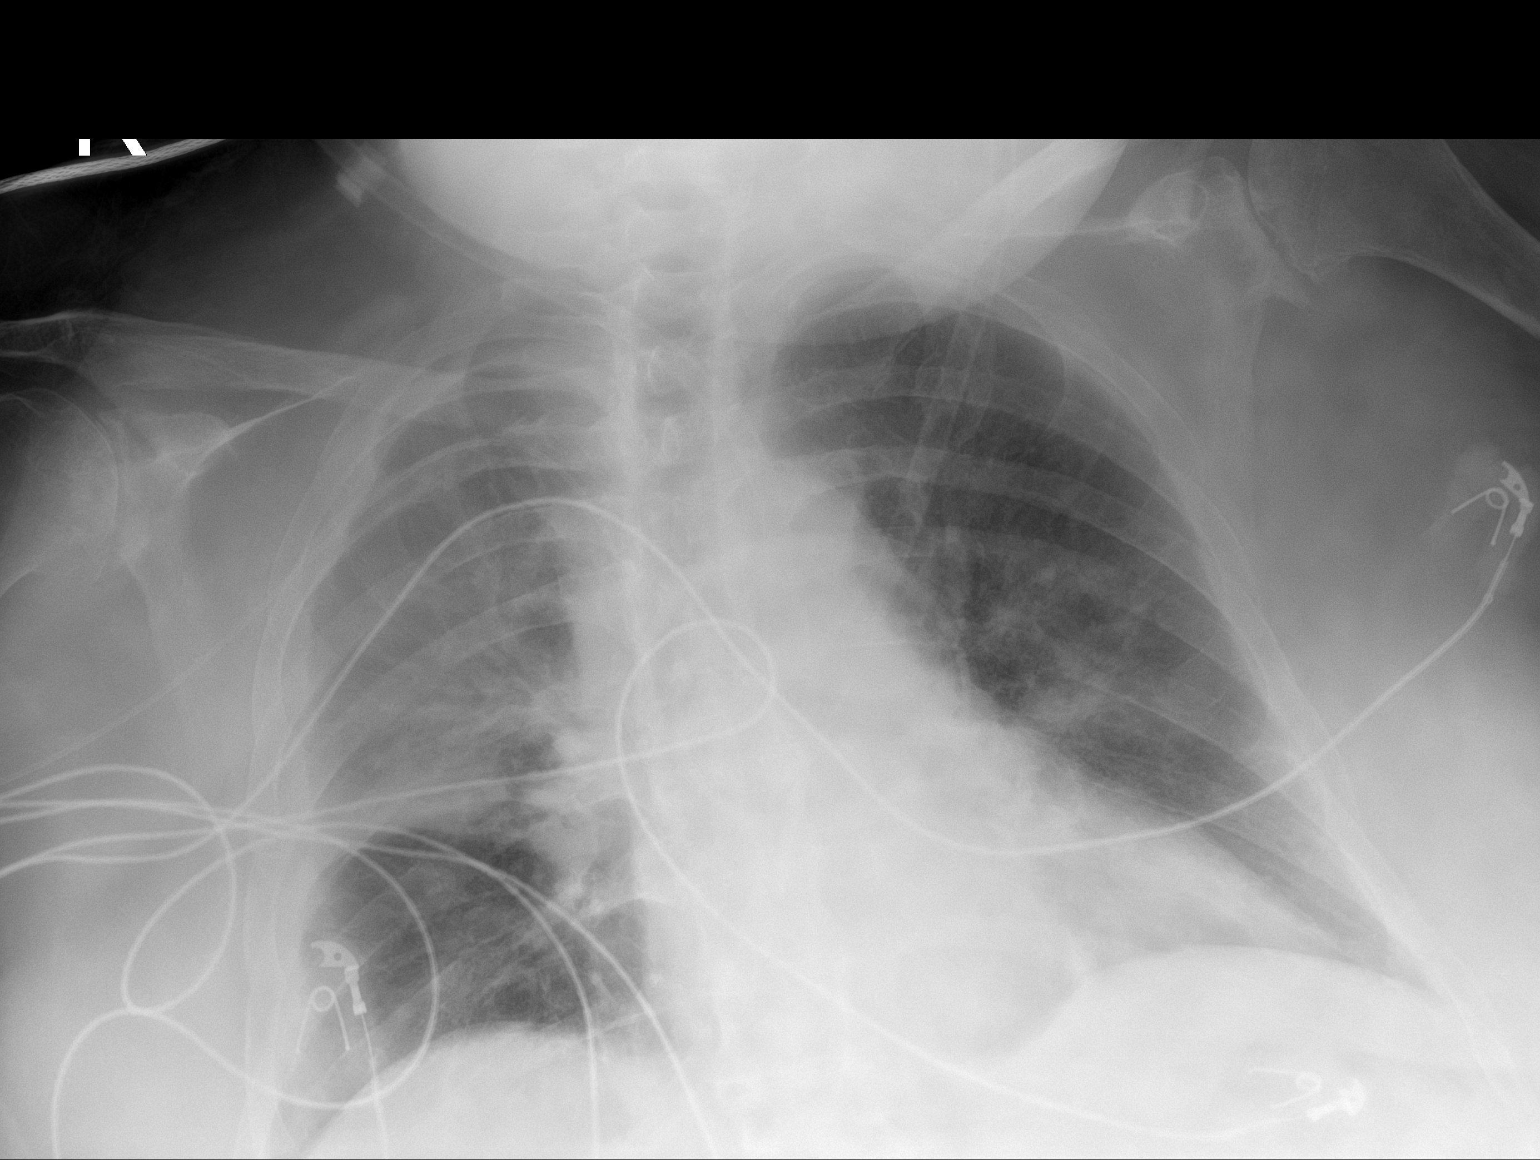

[2 of 2 positions shown; findings below may reference images not displayed]

FINDINGS: Persistent bilateral opacities with similar lung aeration. No
substantial pleural effusion. No pneumothorax. Stable
cardiomediastinal contours. Right PICC line remains present.
IMPRESSION: Similar lung aeration with persistent bilateral opacities.

## 2020-10-06 ENCOUNTER — Other Ambulatory Visit (HOSPITAL_COMMUNITY): Payer: Medicare Other

## 2020-10-06 LAB — CBC
HCT: 33.1 % — ABNORMAL LOW (ref 36.0–46.0)
Hemoglobin: 8.8 g/dL — ABNORMAL LOW (ref 12.0–15.0)
MCH: 23.4 pg — ABNORMAL LOW (ref 26.0–34.0)
MCHC: 26.6 g/dL — ABNORMAL LOW (ref 30.0–36.0)
MCV: 88 fL (ref 80.0–100.0)
Platelets: 233 10*3/uL (ref 150–400)
RBC: 3.76 MIL/uL — ABNORMAL LOW (ref 3.87–5.11)
RDW: 20 % — ABNORMAL HIGH (ref 11.5–15.5)
WBC: 9.9 10*3/uL (ref 4.0–10.5)
nRBC: 0.2 % (ref 0.0–0.2)

## 2020-10-06 LAB — BASIC METABOLIC PANEL
Anion gap: 8 (ref 5–15)
BUN: 14 mg/dL (ref 8–23)
CO2: 29 mmol/L (ref 22–32)
Calcium: 7.8 mg/dL — ABNORMAL LOW (ref 8.9–10.3)
Chloride: 102 mmol/L (ref 98–111)
Creatinine, Ser: 0.64 mg/dL (ref 0.44–1.00)
GFR, Estimated: >60 mL/min (ref >60)
Glucose, Bld: 90 mg/dL (ref 70–99)
Potassium: 4.2 mmol/L (ref 3.5–5.1)
Sodium: 139 mmol/L (ref 135–145)

## 2020-10-06 LAB — PHOSPHORUS: Phosphorus: 3.5 mg/dL (ref 2.5–4.6)

## 2020-10-06 LAB — MAGNESIUM: Magnesium: 1.5 mg/dL — ABNORMAL LOW (ref 1.7–2.4)

## 2020-10-06 IMAGING — MR MR THORACIC SPINE W/O CM
4 of 6 series · 19 of 48 positions shown · non-contrast
Comparison: None.

CLINICAL DATA: Thoracic compression fracture.

EXAM:
MRI THORACIC AND LUMBAR SPINE WITHOUT CONTRAST
TECHNIQUE: Multiplanar and multiecho pulse sequences of the thoracic and lumbar
spine were obtained without intravenous contrast.

[Series 2: T1 · sagittal · 3.0mm · 0.90mm/px · 3 of 12 slices shown (1 of 2)]
[im 1/12]
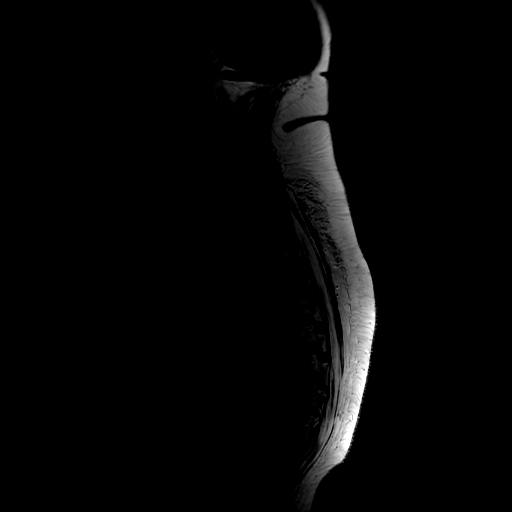
[im 8/12]
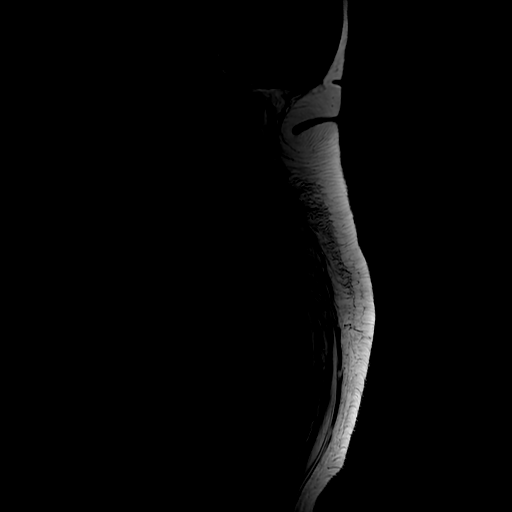
[im 12/12]
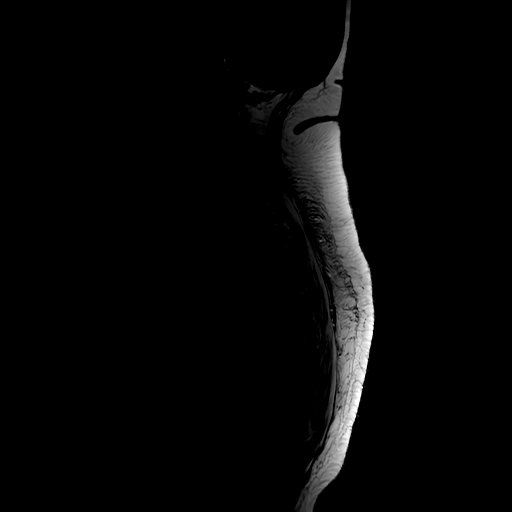

[Series 4: T2 · sagittal · 3.0mm · 0.66mm/px · 6 of 19 slices shown (1 of 2)]
[im 1/19]
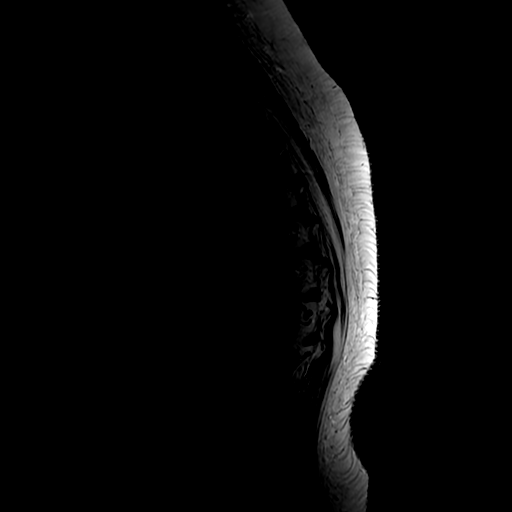
[im 4/19]
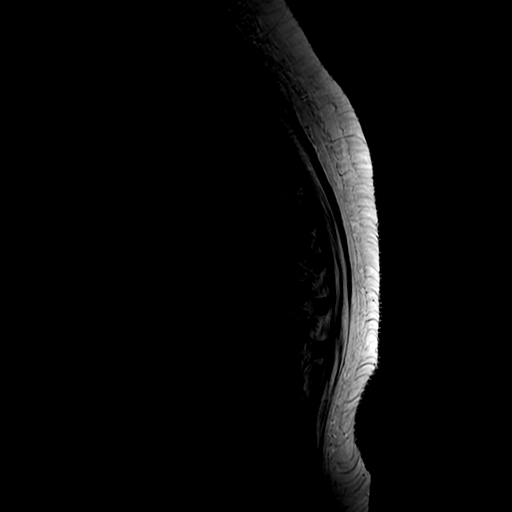
[im 8/19]
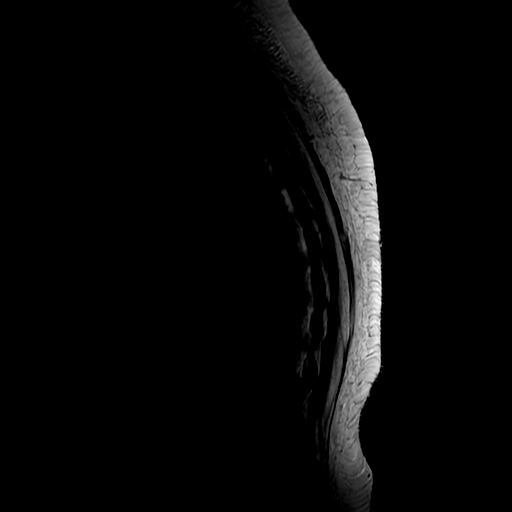
[im 11/19]
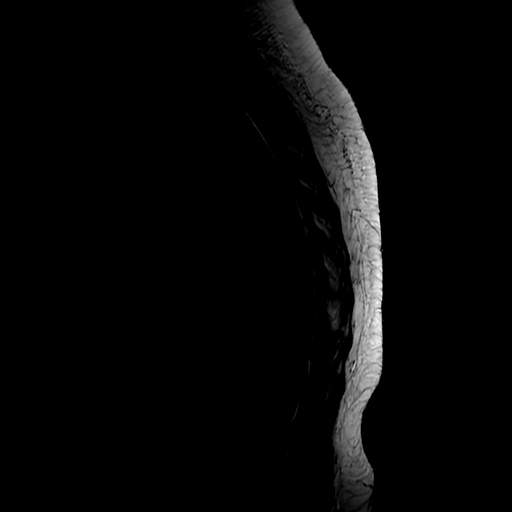
[im 15/19]
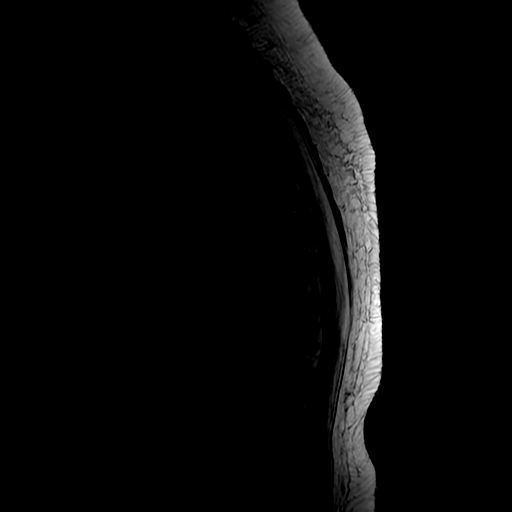
[im 19/19]
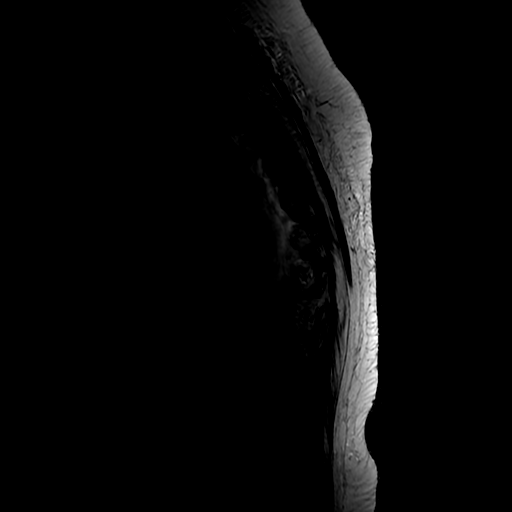

[Series 6: T1 · sagittal · 3.0mm · 0.66mm/px · 3 of 19 slices shown (2 of 2)]
[im 4/19]
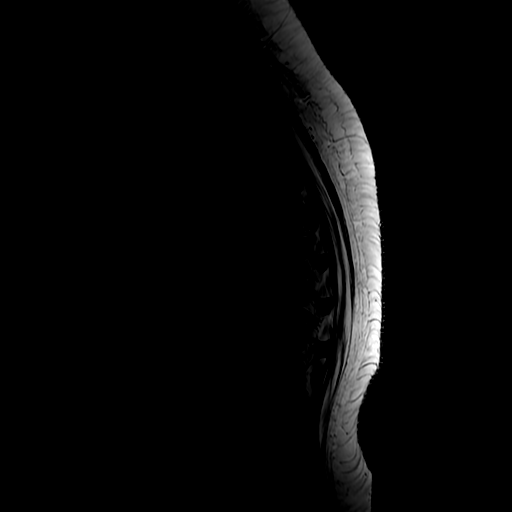
[im 11/19]
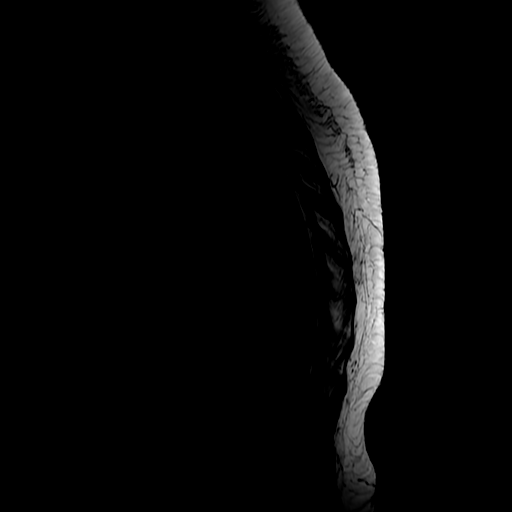
[im 19/19]
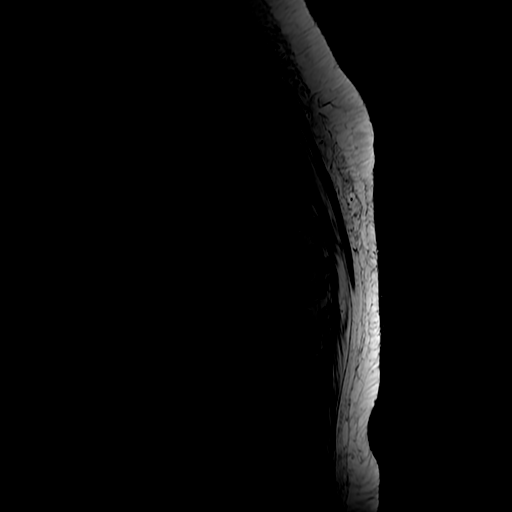

[Series 7: T2 · axial · 4.0mm · 0.39mm/px · z∈[-214,-47]mm · 7 of 44 slices shown (2 of 2)]
[im 1/44]
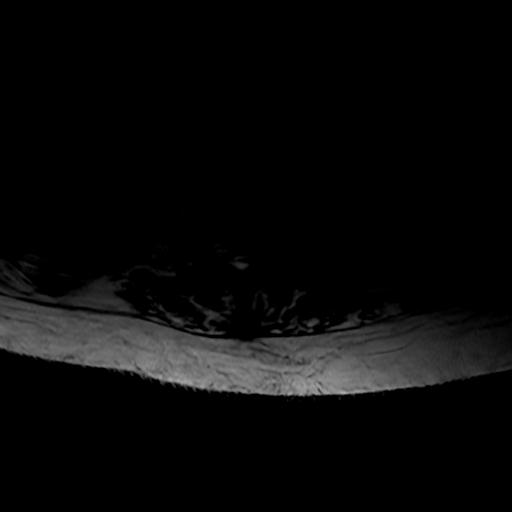
[im 8/44]
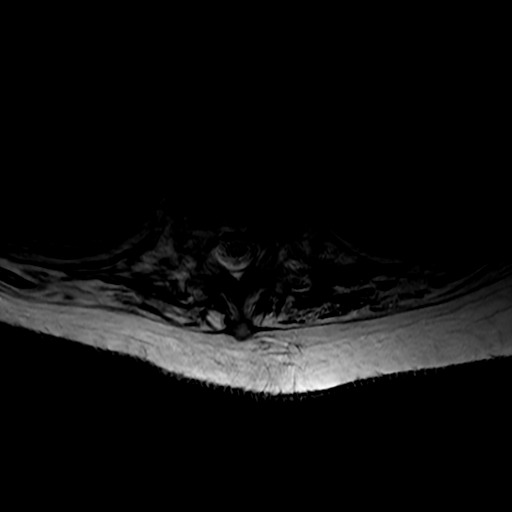
[im 15/44]
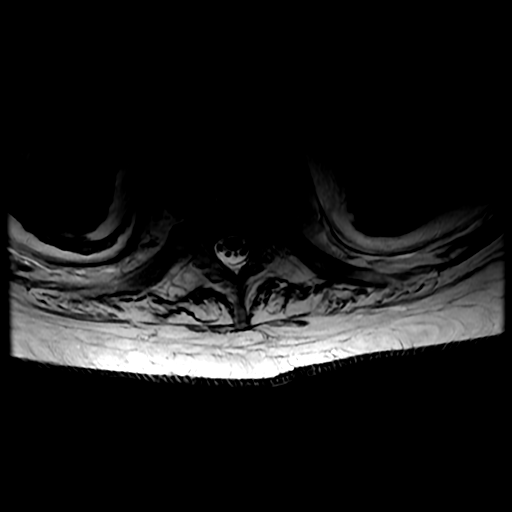
[im 18/44]
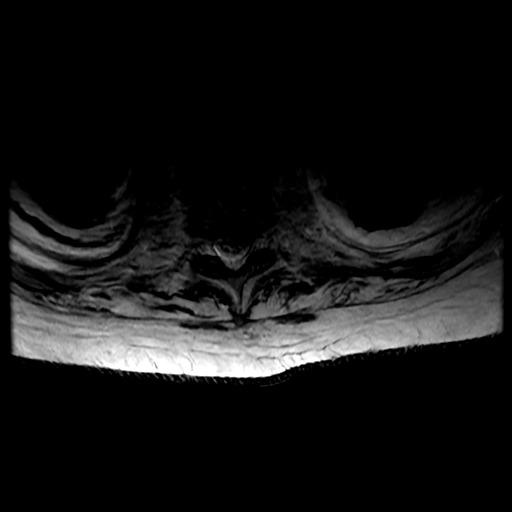
[im 22/44]
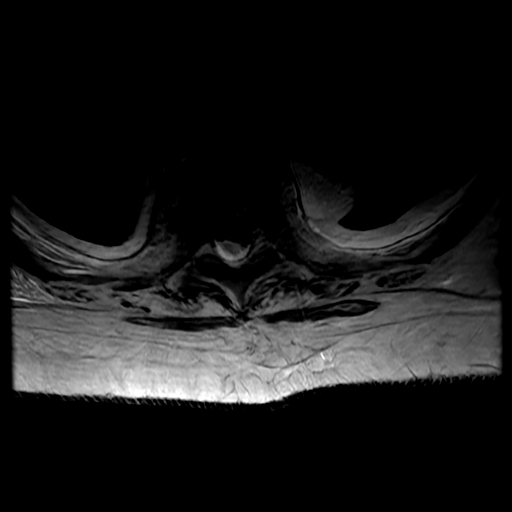
[im 26/44]
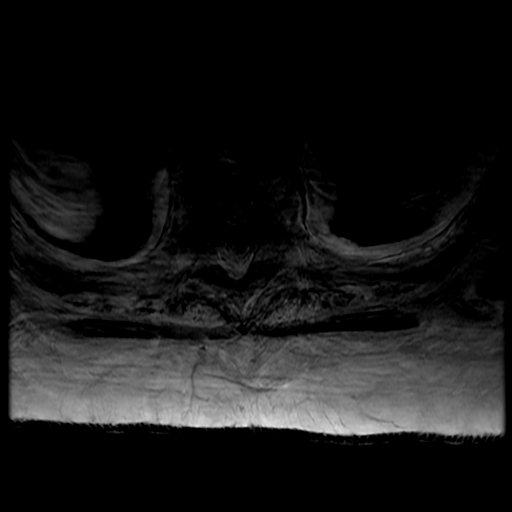
[im 36/44]
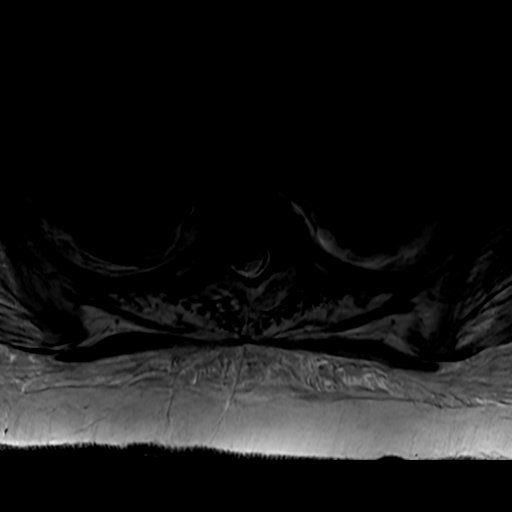

[19 of 48 positions shown; findings below may reference images not displayed]

FINDINGS: MRI THORACIC SPINE FINDINGS

Alignment:  Physiologic.

Vertebrae: Acute to subacute T12 superior endplate compression
fracture without significant height loss. No significant
retropulsion. No evidence of discitis. No suspicious bone lesion.

Cord:  Normal signal and morphology.

Paraspinal and other soft tissues: Moderate loculated bilateral
pleural effusions.

Disc levels:

Mild disc height loss in the lower thoracic spine. No significant
disc bulge or herniation. No spinal canal or neuroforaminal
stenosis.

MRI LUMBAR SPINE FINDINGS

Examination is severely limited by motion artifact. Axial images
were not obtained.

Segmentation:  Complete lumbarization of S1.

Alignment:  Physiologic.

Vertebrae: Acute to subacute L4 compression fracture with 30% height
loss. No significant retropulsion. No evidence of discitis. No
suspicious bone lesion.

Conus medullaris and cauda equina: Conus extends to the L1-L2 level.
Conus and cauda equina appear normal.

Paraspinal and other soft tissues: Nondiagnostic evaluation.

Disc levels:

Only evaluated in the sagittal plane. Disc heights appear relatively
preserved. No high-grade spinal canal stenosis. The neural foramina
are poorly evaluated.
IMPRESSION: 1. Severely limited study due to motion artifact. Axial imag of the
lumbar spine es were not obtained.
2. Acute to subacute T12 and L4 compression fractures.
3. No high-grade spinal canal stenosis.
4. Moderate loculated bilateral pleural effusions.

## 2020-10-06 IMAGING — MR MR LUMBAR SPINE W/O CM
4 series · 19 of 48 positions shown · non-contrast
Comparison: None.

CLINICAL DATA: Thoracic compression fracture.

EXAM:
MRI THORACIC AND LUMBAR SPINE WITHOUT CONTRAST
TECHNIQUE: Multiplanar and multiecho pulse sequences of the thoracic and lumbar
spine were obtained without intravenous contrast.

[Series 10: T2 · sagittal · 4.0mm · 0.55mm/px · 10 of 15 slices shown (1 of 2)]
[im 1/15]
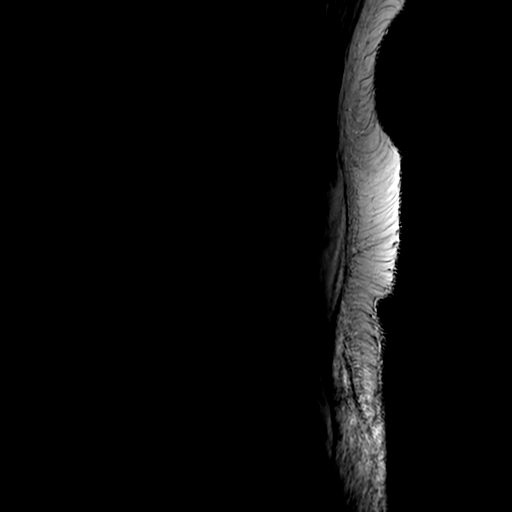
[im 2/15]
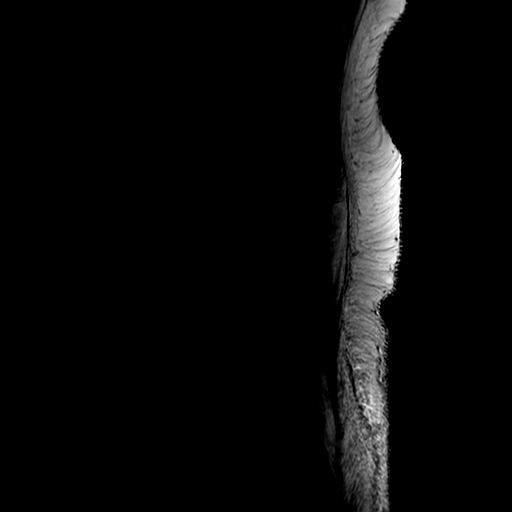
[im 3/15]
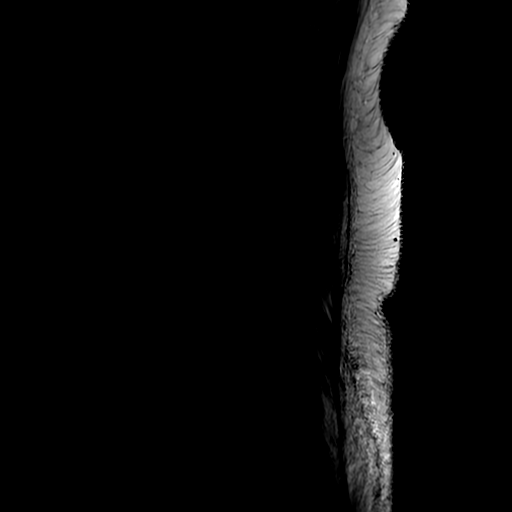
[im 5/15]
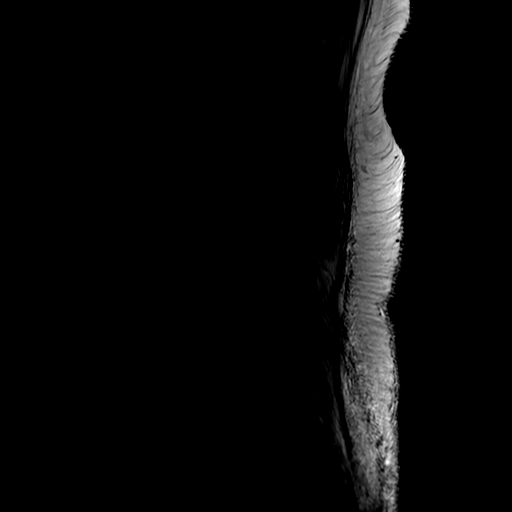
[im 6/15]
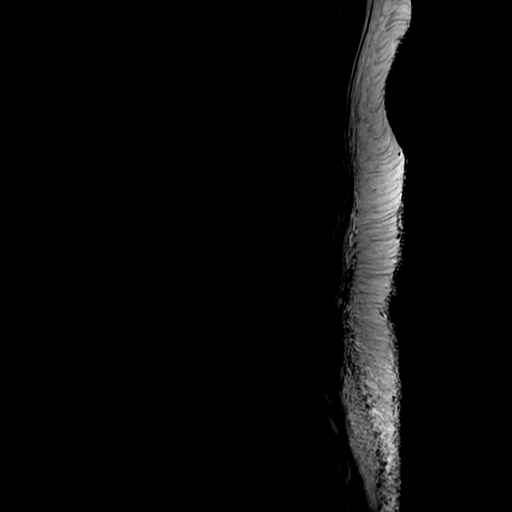
[im 8/15]
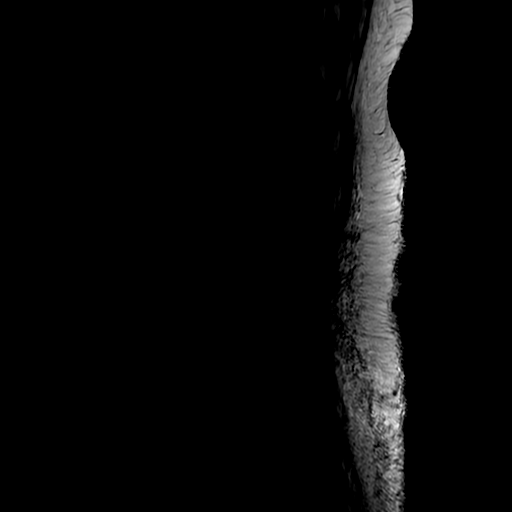
[im 9/15]
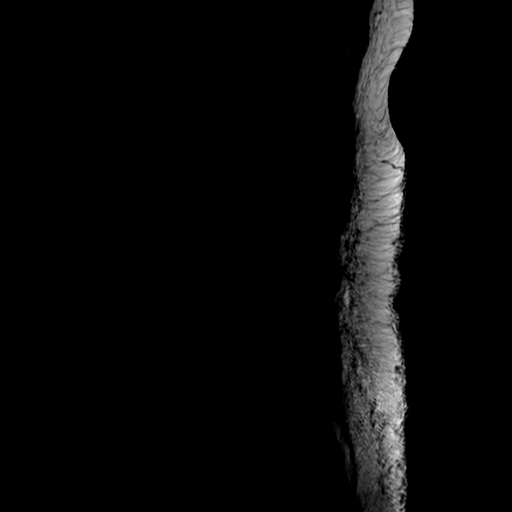
[im 10/15]
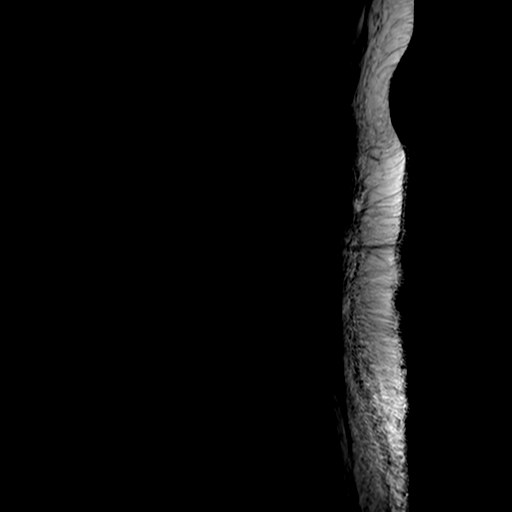
[im 12/15]
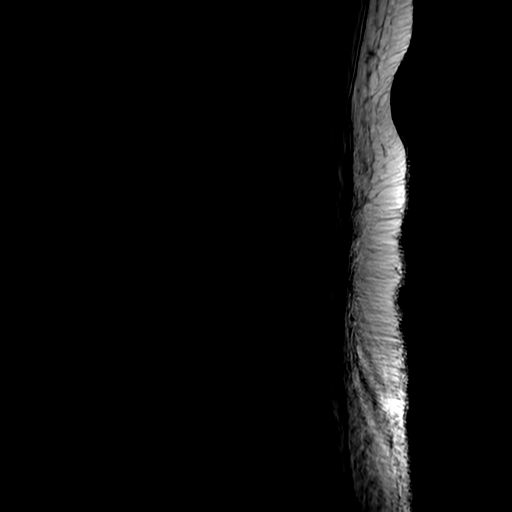
[im 13/15]
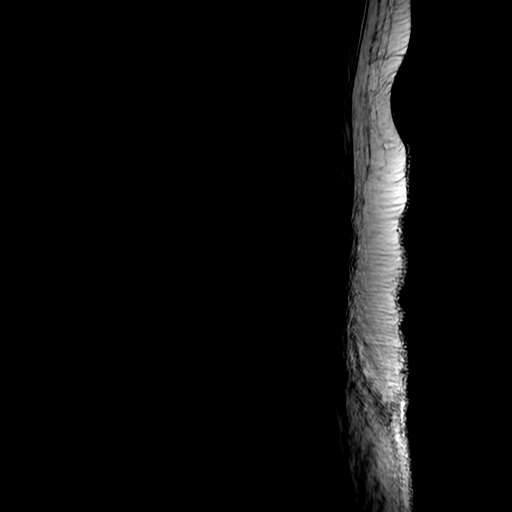

[Series 11: STIR · sagittal · 4.0mm · 0.55mm/px · 3 of 15 slices shown]
[im 2/15]
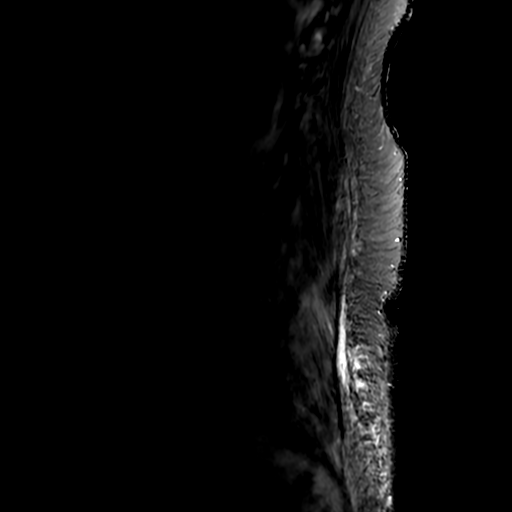
[im 8/15]
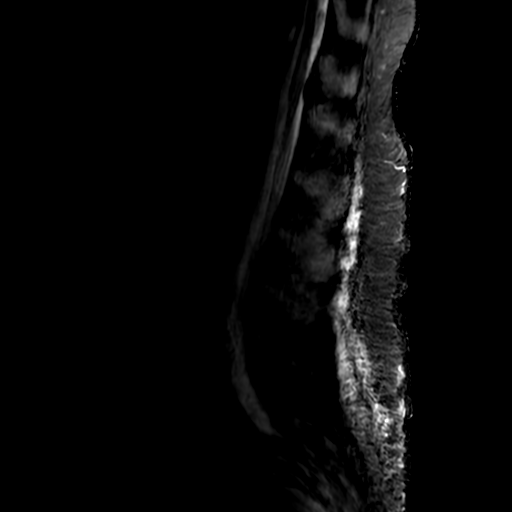
[im 13/15]
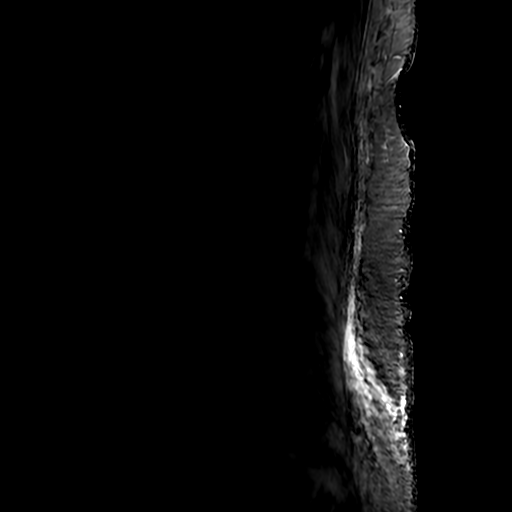

[Series 12: T1 · sagittal · 4.0mm · 0.55mm/px · 3 of 15 slices shown]
[im 2/15]
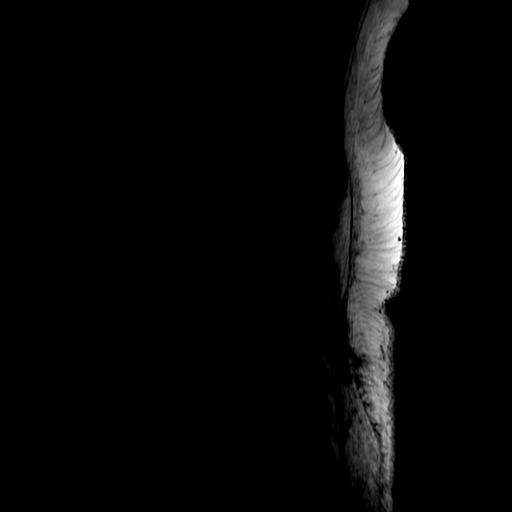
[im 8/15]
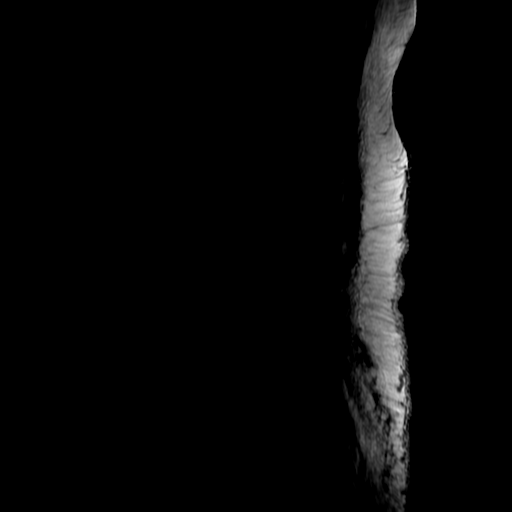
[im 13/15]
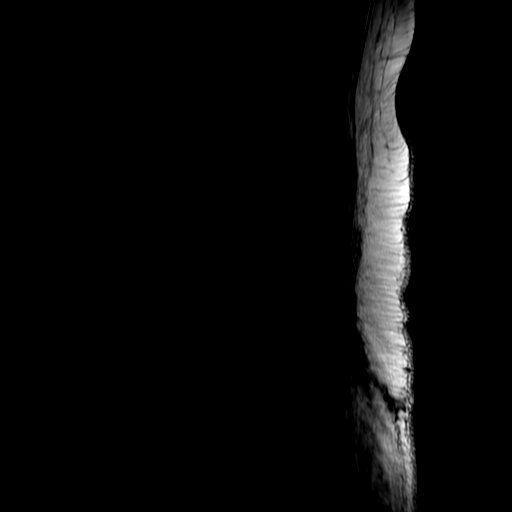

[Series 13: T2 · axial · 4.0mm · 0.39mm/px · z∈[-411,-259]mm · 3 of 20 slices shown (2 of 2)]
[im 3/20]
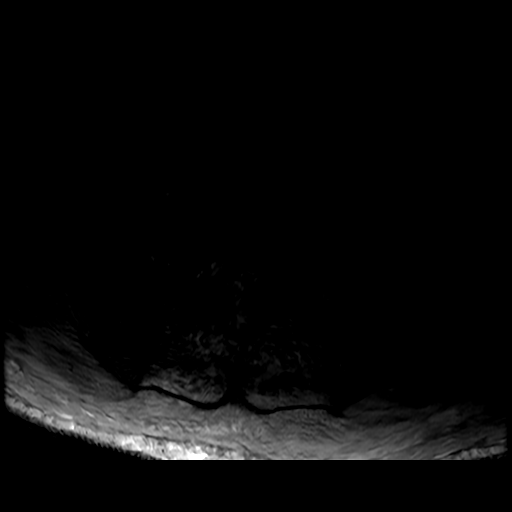
[im 10/20]
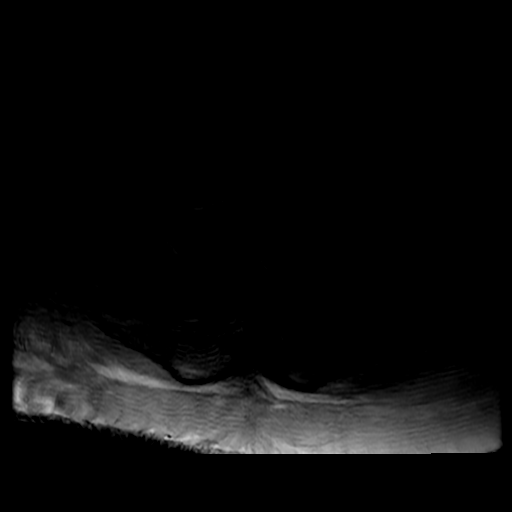
[im 17/20]
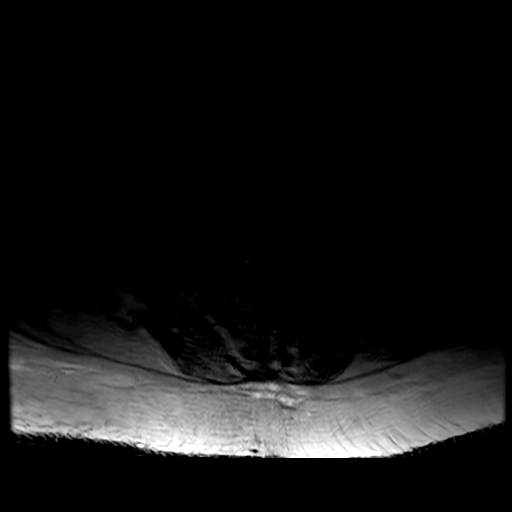

[19 of 48 positions shown; findings below may reference images not displayed]

FINDINGS: MRI THORACIC SPINE FINDINGS

Alignment:  Physiologic.

Vertebrae: Acute to subacute T12 superior endplate compression
fracture without significant height loss. No significant
retropulsion. No evidence of discitis. No suspicious bone lesion.

Cord:  Normal signal and morphology.

Paraspinal and other soft tissues: Moderate loculated bilateral
pleural effusions.

Disc levels:

Mild disc height loss in the lower thoracic spine. No significant
disc bulge or herniation. No spinal canal or neuroforaminal
stenosis.

MRI LUMBAR SPINE FINDINGS

Examination is severely limited by motion artifact. Axial images
were not obtained.

Segmentation:  Complete lumbarization of S1.

Alignment:  Physiologic.

Vertebrae: Acute to subacute L4 compression fracture with 30% height
loss. No significant retropulsion. No evidence of discitis. No
suspicious bone lesion.

Conus medullaris and cauda equina: Conus extends to the L1-L2 level.
Conus and cauda equina appear normal.

Paraspinal and other soft tissues: Nondiagnostic evaluation.

Disc levels:

Only evaluated in the sagittal plane. Disc heights appear relatively
preserved. No high-grade spinal canal stenosis. The neural foramina
are poorly evaluated.
IMPRESSION: 1. Severely limited study due to motion artifact. Axial imag of the
lumbar spine es were not obtained.
2. Acute to subacute T12 and L4 compression fractures.
3. No high-grade spinal canal stenosis.
4. Moderate loculated bilateral pleural effusions.

## 2020-10-06 DEATH — deceased

## 2020-10-07 LAB — MAGNESIUM: Magnesium: 1.7 mg/dL (ref 1.7–2.4)

## 2020-10-08 ENCOUNTER — Other Ambulatory Visit (HOSPITAL_COMMUNITY): Payer: Medicare Other

## 2020-10-08 LAB — CBC
HCT: 32.4 % — ABNORMAL LOW (ref 36.0–46.0)
Hemoglobin: 8.7 g/dL — ABNORMAL LOW (ref 12.0–15.0)
MCH: 23.4 pg — ABNORMAL LOW (ref 26.0–34.0)
MCHC: 26.9 g/dL — ABNORMAL LOW (ref 30.0–36.0)
MCV: 87.1 fL (ref 80.0–100.0)
Platelets: 258 10*3/uL (ref 150–400)
RBC: 3.72 MIL/uL — ABNORMAL LOW (ref 3.87–5.11)
RDW: 19.2 % — ABNORMAL HIGH (ref 11.5–15.5)
WBC: 10.3 10*3/uL (ref 4.0–10.5)
nRBC: 0.2 % (ref 0.0–0.2)

## 2020-10-08 LAB — MAGNESIUM: Magnesium: 2 mg/dL (ref 1.7–2.4)

## 2020-10-08 LAB — BASIC METABOLIC PANEL
Anion gap: 8 (ref 5–15)
BUN: 16 mg/dL (ref 8–23)
CO2: 29 mmol/L (ref 22–32)
Calcium: 7.4 mg/dL — ABNORMAL LOW (ref 8.9–10.3)
Chloride: 99 mmol/L (ref 98–111)
Creatinine, Ser: 0.71 mg/dL (ref 0.44–1.00)
GFR, Estimated: >60 mL/min (ref >60)
Glucose, Bld: 173 mg/dL — ABNORMAL HIGH (ref 70–99)
Potassium: 3.5 mmol/L (ref 3.5–5.1)
Sodium: 136 mmol/L (ref 135–145)

## 2020-10-08 LAB — PHOSPHORUS: Phosphorus: 3.1 mg/dL (ref 2.5–4.6)

## 2020-10-08 IMAGING — DX DG CHEST 1V PORT
1 series · 1 of 1 positions shown · non-contrast
Comparison: Chest x-ray [DATE].

CLINICAL DATA: 64-year-old female with history of pleural effusion.

EXAM:
PORTABLE CHEST 1 VIEW

[chest ap]
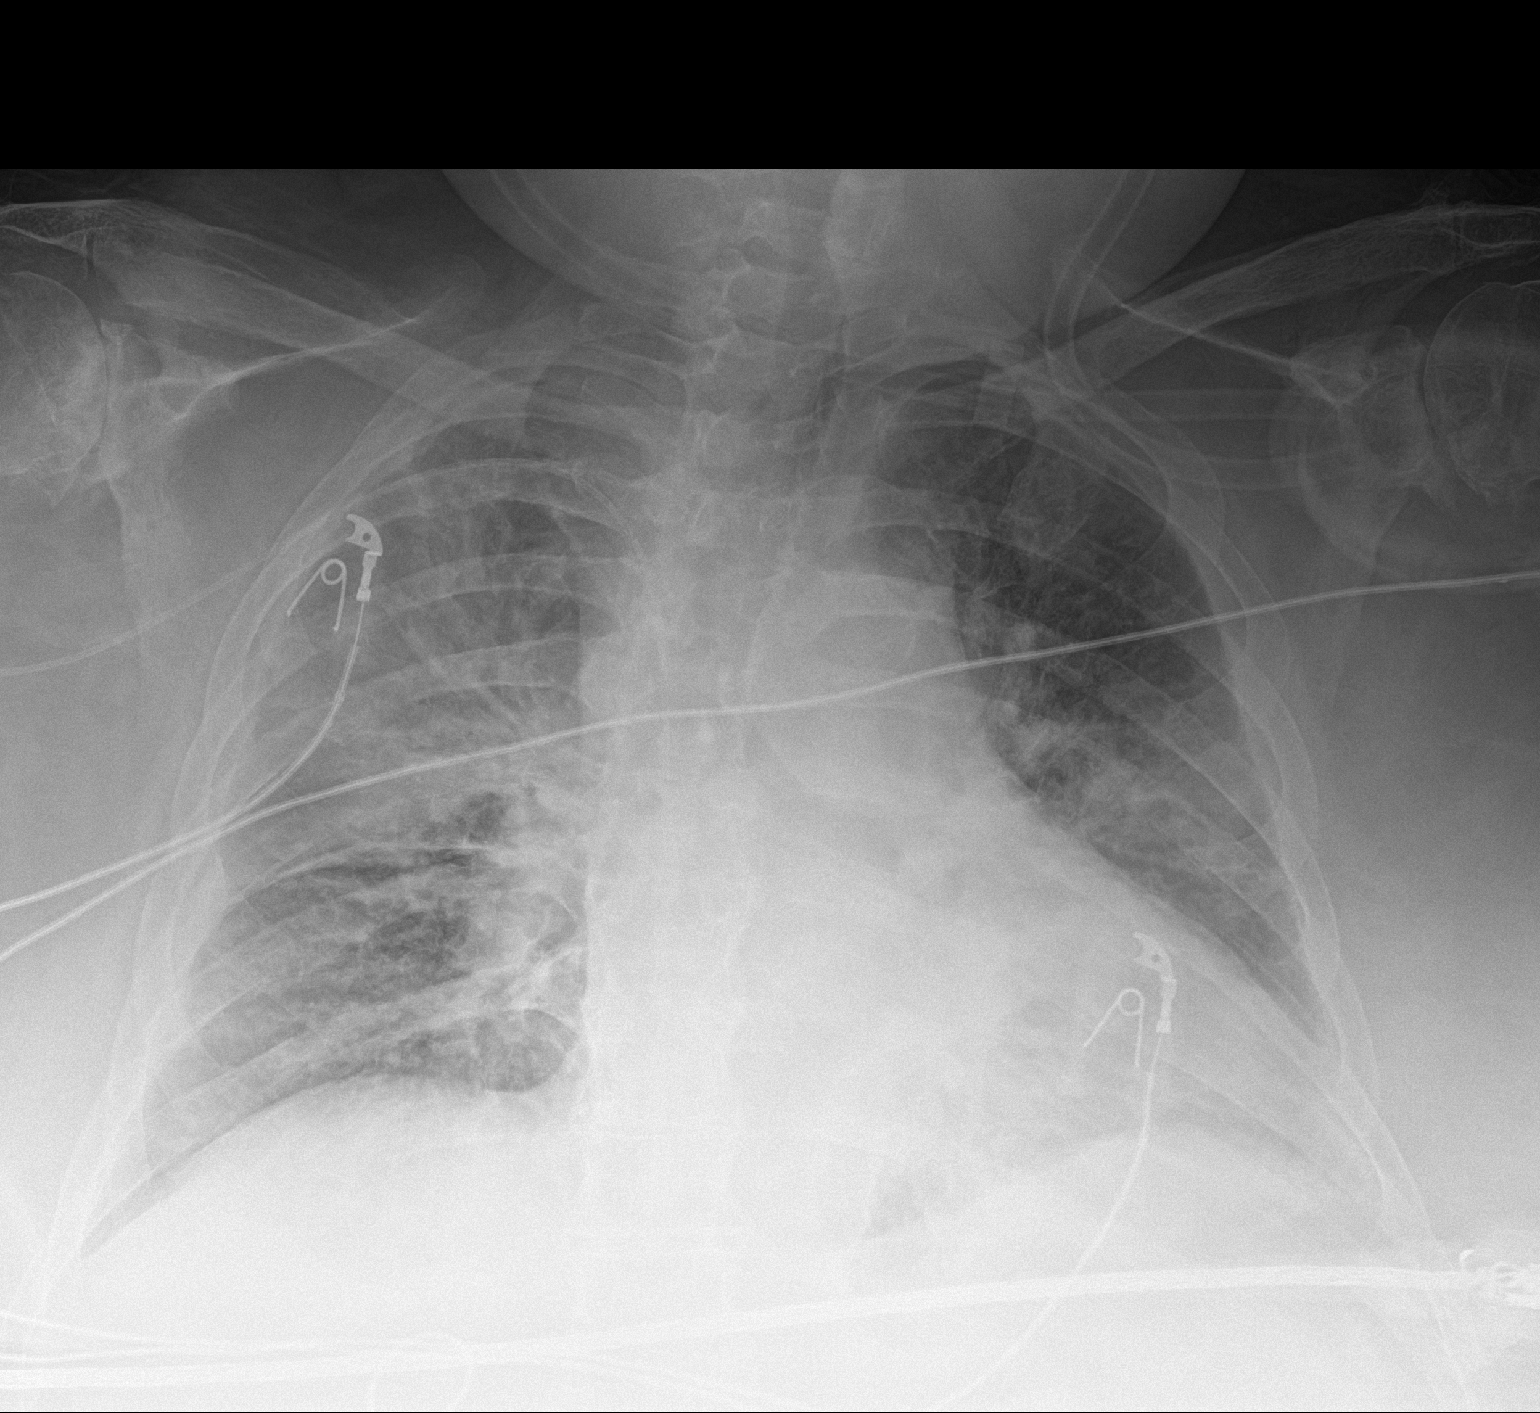

[1 of 1 positions shown; findings below may reference images not displayed]

FINDINGS: Insert PICC right low lung volumes. Patchy areas of interstitial
prominence an ill-defined airspace disease noted throughout the
lungs bilaterally, most confluent in the right upper lobe. No
definite pleural effusions. No pneumothorax. Heart size is
borderline enlarged. The patient is rotated to the distal superior
vena cava on today's exam, resulting in distortion of the
mediastinal contours and reduced diagnostic sensitivity and
specificity for mediastinal pathology. Atherosclerotic
calcifications in the thoracic aorta.
IMPRESSION: 1. Support apparatus, as above.
2. Severe multilobar bilateral pneumonia with slightly worsened
aeration compared to the prior study, as above.
3. Aortic atherosclerosis.

## 2020-10-10 LAB — RENAL FUNCTION PANEL
Albumin: <1.5 g/dL — ABNORMAL LOW (ref 3.5–5.0)
Anion gap: 8 (ref 5–15)
BUN: 19 mg/dL (ref 8–23)
CO2: 29 mmol/L (ref 22–32)
Calcium: 7.5 mg/dL — ABNORMAL LOW (ref 8.9–10.3)
Chloride: 98 mmol/L (ref 98–111)
Creatinine, Ser: 0.73 mg/dL (ref 0.44–1.00)
GFR, Estimated: >60 mL/min (ref >60)
Glucose, Bld: 89 mg/dL (ref 70–99)
Phosphorus: 3.5 mg/dL (ref 2.5–4.6)
Potassium: 3.2 mmol/L — ABNORMAL LOW (ref 3.5–5.1)
Sodium: 135 mmol/L (ref 135–145)

## 2020-10-10 LAB — CBC
HCT: 33 % — ABNORMAL LOW (ref 36.0–46.0)
Hemoglobin: 8.8 g/dL — ABNORMAL LOW (ref 12.0–15.0)
MCH: 23.3 pg — ABNORMAL LOW (ref 26.0–34.0)
MCHC: 26.7 g/dL — ABNORMAL LOW (ref 30.0–36.0)
MCV: 87.3 fL (ref 80.0–100.0)
Platelets: 266 10*3/uL (ref 150–400)
RBC: 3.78 MIL/uL — ABNORMAL LOW (ref 3.87–5.11)
RDW: 19.2 % — ABNORMAL HIGH (ref 11.5–15.5)
WBC: 9.2 10*3/uL (ref 4.0–10.5)
nRBC: 0 % (ref 0.0–0.2)

## 2020-10-10 LAB — POTASSIUM: Potassium: 4.2 mmol/L (ref 3.5–5.1)

## 2020-10-13 LAB — RENAL FUNCTION PANEL
Albumin: <1.5 g/dL — ABNORMAL LOW (ref 3.5–5.0)
Anion gap: 8 (ref 5–15)
BUN: 25 mg/dL — ABNORMAL HIGH (ref 8–23)
CO2: 28 mmol/L (ref 22–32)
Calcium: 7.8 mg/dL — ABNORMAL LOW (ref 8.9–10.3)
Chloride: 101 mmol/L (ref 98–111)
Creatinine, Ser: 0.93 mg/dL (ref 0.44–1.00)
GFR, Estimated: >60 mL/min (ref >60)
Glucose, Bld: 118 mg/dL — ABNORMAL HIGH (ref 70–99)
Phosphorus: 3.6 mg/dL (ref 2.5–4.6)
Potassium: 4.1 mmol/L (ref 3.5–5.1)
Sodium: 137 mmol/L (ref 135–145)

## 2020-10-13 LAB — MAGNESIUM: Magnesium: 1.5 mg/dL — ABNORMAL LOW (ref 1.7–2.4)

## 2020-10-13 LAB — CBC
HCT: 33.4 % — ABNORMAL LOW (ref 36.0–46.0)
Hemoglobin: 9.2 g/dL — ABNORMAL LOW (ref 12.0–15.0)
MCH: 23.8 pg — ABNORMAL LOW (ref 26.0–34.0)
MCHC: 27.5 g/dL — ABNORMAL LOW (ref 30.0–36.0)
MCV: 86.5 fL (ref 80.0–100.0)
Platelets: 282 10*3/uL (ref 150–400)
RBC: 3.86 MIL/uL — ABNORMAL LOW (ref 3.87–5.11)
RDW: 18.6 % — ABNORMAL HIGH (ref 11.5–15.5)
WBC: 10.5 10*3/uL (ref 4.0–10.5)
nRBC: 0.2 % (ref 0.0–0.2)

## 2020-10-14 LAB — MAGNESIUM: Magnesium: 1.8 mg/dL (ref 1.7–2.4)

## 2020-10-15 LAB — BASIC METABOLIC PANEL
Anion gap: 10 (ref 5–15)
BUN: 31 mg/dL — ABNORMAL HIGH (ref 8–23)
CO2: 25 mmol/L (ref 22–32)
Calcium: 7.5 mg/dL — ABNORMAL LOW (ref 8.9–10.3)
Chloride: 103 mmol/L (ref 98–111)
Creatinine, Ser: 1.09 mg/dL — ABNORMAL HIGH (ref 0.44–1.00)
GFR, Estimated: 57 mL/min — ABNORMAL LOW (ref >60)
Glucose, Bld: 88 mg/dL (ref 70–99)
Potassium: 5.4 mmol/L — ABNORMAL HIGH (ref 3.5–5.1)
Sodium: 138 mmol/L (ref 135–145)

## 2020-10-16 ENCOUNTER — Other Ambulatory Visit (HOSPITAL_COMMUNITY): Payer: Medicare Other

## 2020-10-16 LAB — CBC
HCT: 30.4 % — ABNORMAL LOW (ref 36.0–46.0)
Hemoglobin: 8.2 g/dL — ABNORMAL LOW (ref 12.0–15.0)
MCH: 23.6 pg — ABNORMAL LOW (ref 26.0–34.0)
MCHC: 27 g/dL — ABNORMAL LOW (ref 30.0–36.0)
MCV: 87.4 fL (ref 80.0–100.0)
Platelets: 206 10*3/uL (ref 150–400)
RBC: 3.48 MIL/uL — ABNORMAL LOW (ref 3.87–5.11)
RDW: 18.7 % — ABNORMAL HIGH (ref 11.5–15.5)
WBC: 11.4 10*3/uL — ABNORMAL HIGH (ref 4.0–10.5)
nRBC: 0 % (ref 0.0–0.2)

## 2020-10-16 LAB — RENAL FUNCTION PANEL
Albumin: <1.5 g/dL — ABNORMAL LOW (ref 3.5–5.0)
Anion gap: 9 (ref 5–15)
BUN: 36 mg/dL — ABNORMAL HIGH (ref 8–23)
CO2: 27 mmol/L (ref 22–32)
Calcium: 7.6 mg/dL — ABNORMAL LOW (ref 8.9–10.3)
Chloride: 101 mmol/L (ref 98–111)
Creatinine, Ser: 1.31 mg/dL — ABNORMAL HIGH (ref 0.44–1.00)
GFR, Estimated: 46 mL/min — ABNORMAL LOW (ref >60)
Glucose, Bld: 98 mg/dL (ref 70–99)
Phosphorus: 4.5 mg/dL (ref 2.5–4.6)
Potassium: 3.4 mmol/L — ABNORMAL LOW (ref 3.5–5.1)
Sodium: 137 mmol/L (ref 135–145)

## 2020-10-16 LAB — MAGNESIUM: Magnesium: 1.7 mg/dL (ref 1.7–2.4)

## 2020-10-16 IMAGING — DX DG CHEST 1V PORT
1 series · 1 of 1 positions shown · non-contrast
Comparison: [DATE]

CLINICAL DATA: Pneumonia.

EXAM:
PORTABLE CHEST 1 VIEW

[chest]
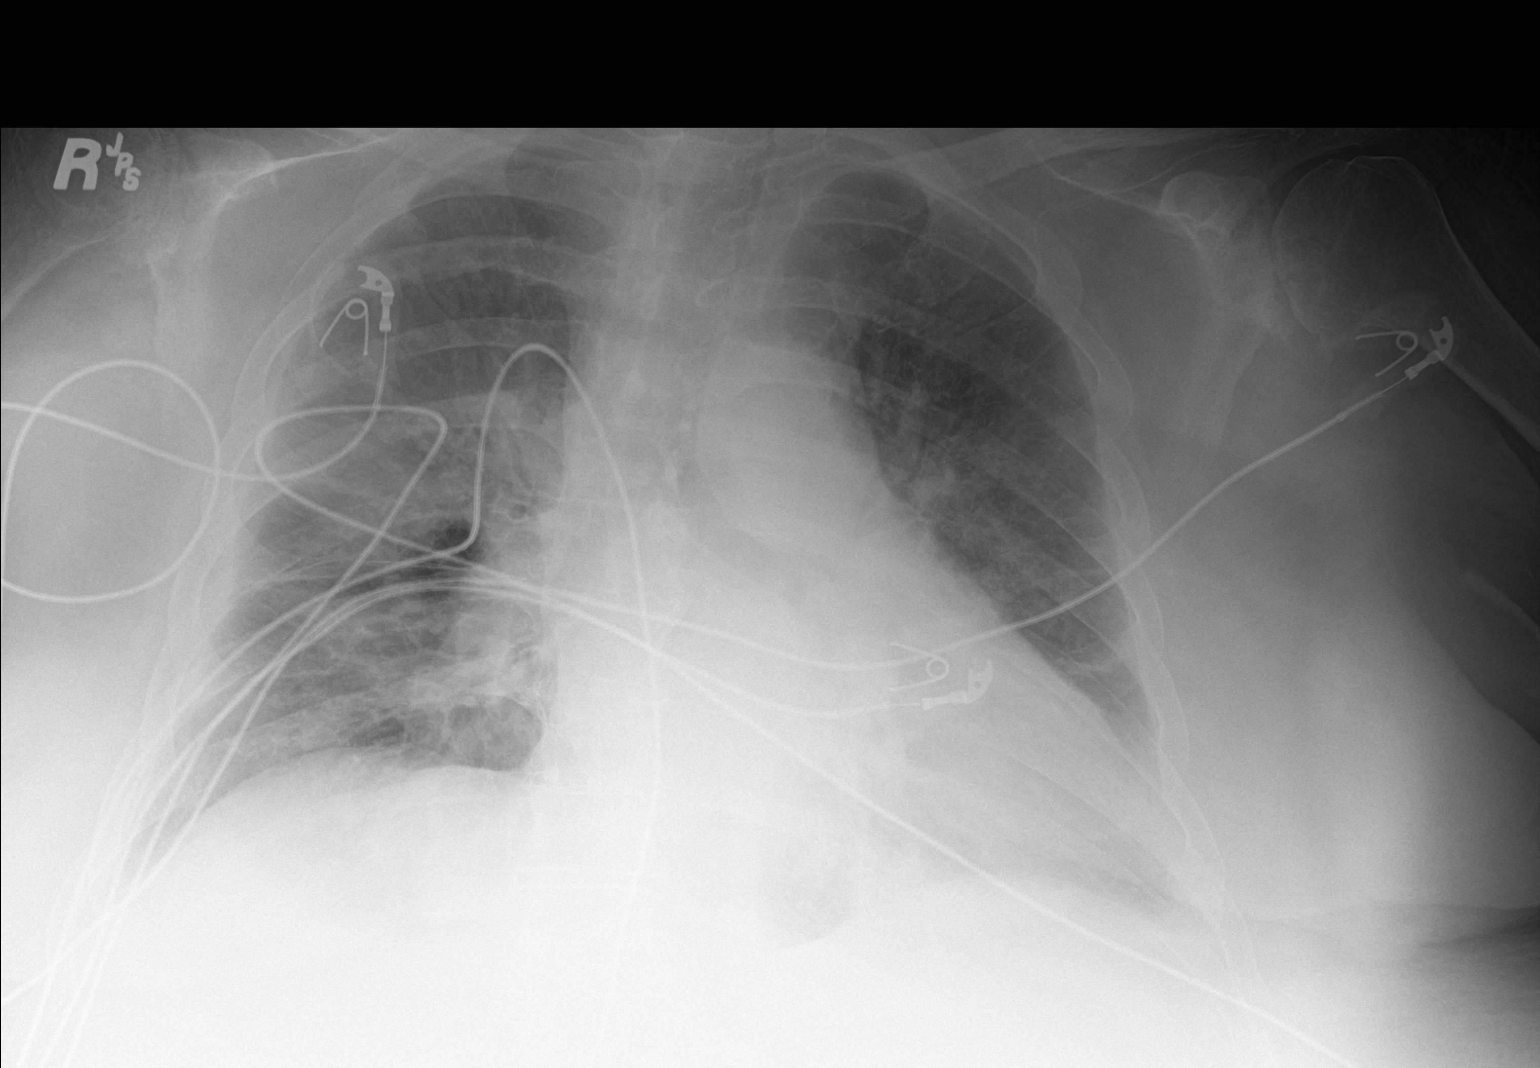

[1 of 1 positions shown; findings below may reference images not displayed]

FINDINGS: The cardiac silhouette, mediastinal and hilar contours are stable.

Persistent but improving bilateral pulmonary infiltrates. No
definite pleural effusions or pneumothorax.
IMPRESSION: Persistent but improving bilateral pulmonary infiltrates.

## 2020-10-17 LAB — RENAL FUNCTION PANEL
Albumin: <1.5 g/dL — ABNORMAL LOW (ref 3.5–5.0)
Anion gap: 10 (ref 5–15)
BUN: 41 mg/dL — ABNORMAL HIGH (ref 8–23)
CO2: 24 mmol/L (ref 22–32)
Calcium: 7.5 mg/dL — ABNORMAL LOW (ref 8.9–10.3)
Chloride: 103 mmol/L (ref 98–111)
Creatinine, Ser: 1.76 mg/dL — ABNORMAL HIGH (ref 0.44–1.00)
GFR, Estimated: 32 mL/min — ABNORMAL LOW (ref >60)
Glucose, Bld: 118 mg/dL — ABNORMAL HIGH (ref 70–99)
Phosphorus: 5.1 mg/dL — ABNORMAL HIGH (ref 2.5–4.6)
Potassium: 4.3 mmol/L (ref 3.5–5.1)
Sodium: 137 mmol/L (ref 135–145)

## 2020-10-17 LAB — MAGNESIUM: Magnesium: 1.8 mg/dL (ref 1.7–2.4)

## 2020-10-18 ENCOUNTER — Other Ambulatory Visit (HOSPITAL_COMMUNITY): Payer: Medicare Other

## 2020-10-18 LAB — CBC
HCT: 30.1 % — ABNORMAL LOW (ref 36.0–46.0)
Hemoglobin: 7.9 g/dL — ABNORMAL LOW (ref 12.0–15.0)
MCH: 23.6 pg — ABNORMAL LOW (ref 26.0–34.0)
MCHC: 26.2 g/dL — ABNORMAL LOW (ref 30.0–36.0)
MCV: 89.9 fL (ref 80.0–100.0)
Platelets: 220 10*3/uL (ref 150–400)
RBC: 3.35 MIL/uL — ABNORMAL LOW (ref 3.87–5.11)
RDW: 19.3 % — ABNORMAL HIGH (ref 11.5–15.5)
WBC: 17.6 10*3/uL — ABNORMAL HIGH (ref 4.0–10.5)
nRBC: 0.1 % (ref 0.0–0.2)

## 2020-10-18 LAB — URINALYSIS, ROUTINE W REFLEX MICROSCOPIC
Glucose, UA: NEGATIVE mg/dL
Ketones, ur: NEGATIVE mg/dL
Nitrite: NEGATIVE
Protein, ur: 100 mg/dL — AB
RBC / HPF: >50 RBC/hpf — ABNORMAL HIGH (ref 0–5)
Specific Gravity, Urine: 1.023 (ref 1.005–1.030)
WBC, UA: >50 WBC/hpf — ABNORMAL HIGH (ref 0–5)
pH: 5 (ref 5.0–8.0)

## 2020-10-18 LAB — BLOOD GAS, ARTERIAL
Acid-base deficit: 4.7 mmol/L — ABNORMAL HIGH (ref 0.0–2.0)
Acid-base deficit: 4.9 mmol/L — ABNORMAL HIGH (ref 0.0–2.0)
Bicarbonate: 24 mmol/L (ref 20.0–28.0)
Bicarbonate: 22.7 mmol/L (ref 20.0–28.0)
FIO2: 70
FIO2: 65
O2 Saturation: 93.3 %
O2 Saturation: 85.9 %
Patient temperature: 37
Patient temperature: 36
pCO2 arterial: 82.1 mmHg (ref 32.0–48.0)
pCO2 arterial: 64.8 mmHg — ABNORMAL HIGH (ref 32.0–48.0)
pH, Arterial: 7.093 — CL (ref 7.350–7.450)
pH, Arterial: 7.164 — CL (ref 7.350–7.450)
pO2, Arterial: 88.3 mmHg (ref 83.0–108.0)
pO2, Arterial: 60.6 mmHg — ABNORMAL LOW (ref 83.0–108.0)

## 2020-10-18 LAB — MAGNESIUM: Magnesium: 2.3 mg/dL (ref 1.7–2.4)

## 2020-10-18 LAB — RENAL FUNCTION PANEL
Albumin: <1.5 g/dL — ABNORMAL LOW (ref 3.5–5.0)
Anion gap: 11 (ref 5–15)
BUN: 45 mg/dL — ABNORMAL HIGH (ref 8–23)
CO2: 24 mmol/L (ref 22–32)
Calcium: 7.5 mg/dL — ABNORMAL LOW (ref 8.9–10.3)
Chloride: 100 mmol/L (ref 98–111)
Creatinine, Ser: 2.28 mg/dL — ABNORMAL HIGH (ref 0.44–1.00)
GFR, Estimated: 24 mL/min — ABNORMAL LOW (ref >60)
Glucose, Bld: 104 mg/dL — ABNORMAL HIGH (ref 70–99)
Phosphorus: 6.2 mg/dL — ABNORMAL HIGH (ref 2.5–4.6)
Potassium: 4.9 mmol/L (ref 3.5–5.1)
Sodium: 135 mmol/L (ref 135–145)

## 2020-10-18 IMAGING — US US RENAL
1 series · 14 of 22 positions shown · non-contrast
Comparison: None.

CLINICAL DATA: RODRIGUES

EXAM:
RENAL / URINARY TRACT ULTRASOUND COMPLETE

[Series 1: us renal · 14 of 22 slices shown]
[im 1/22]
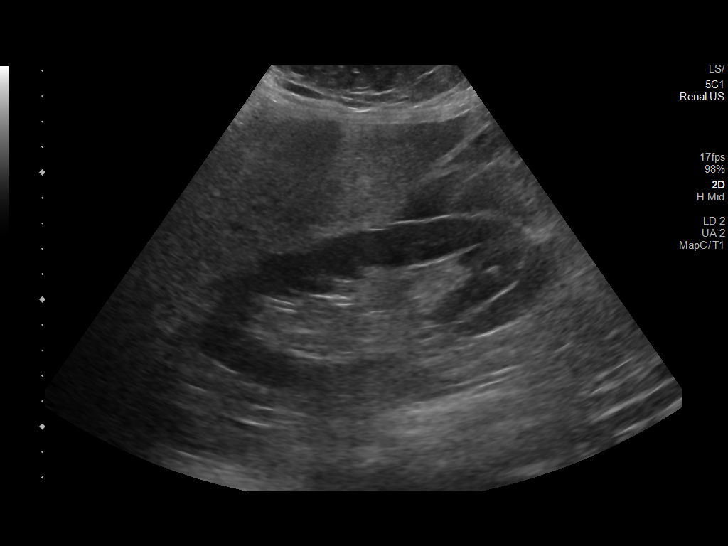
[im 3/22]
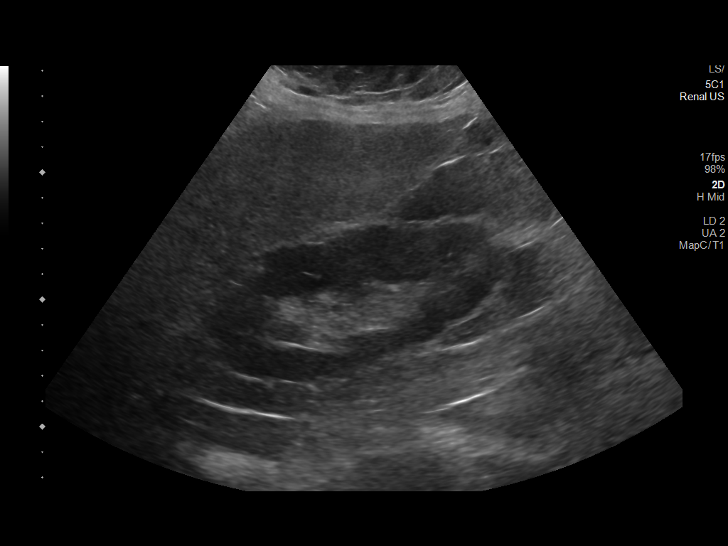
[im 4/22]
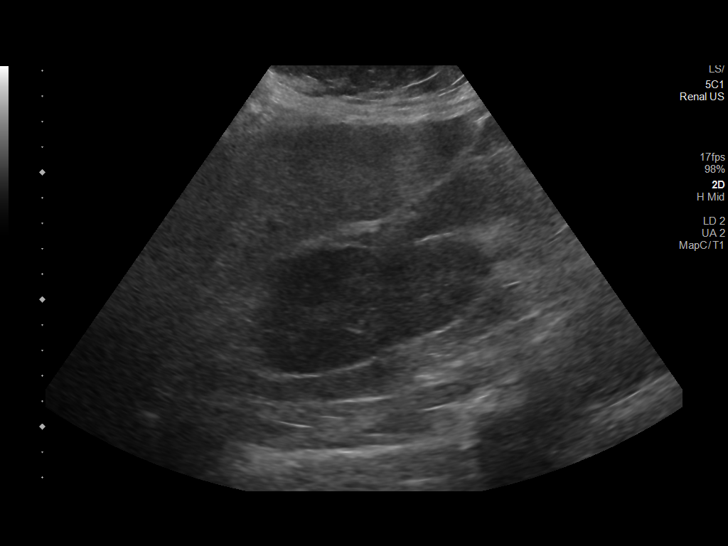
[im 6/22]
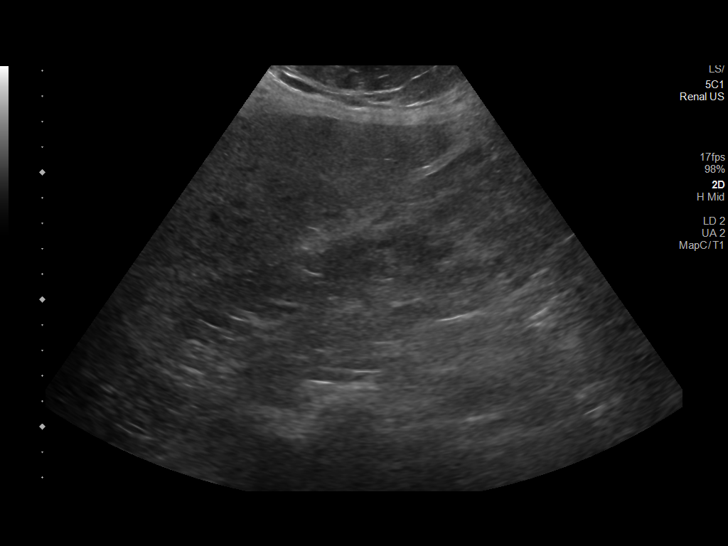
[im 8/22]
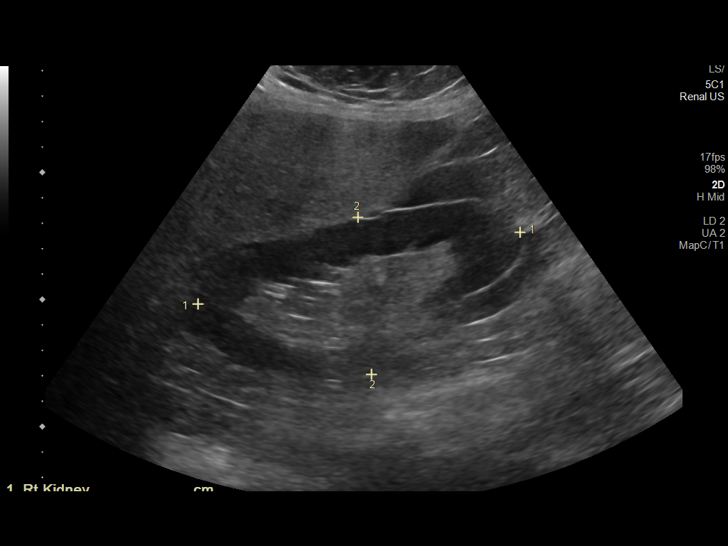
[im 9/22]
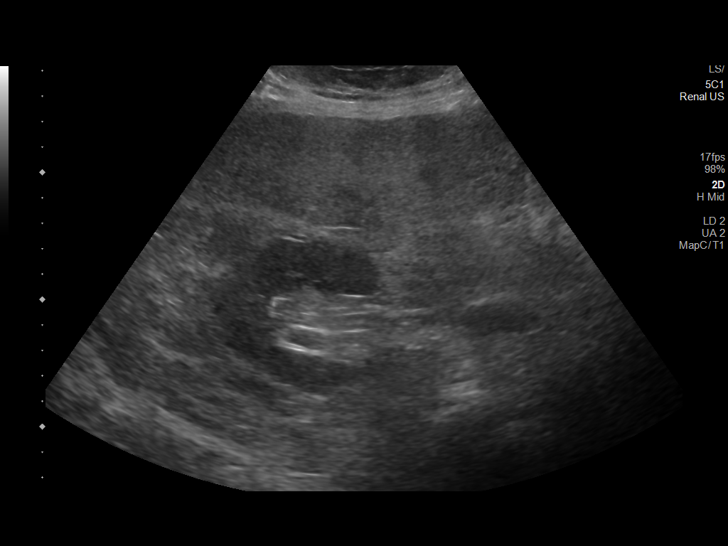
[im 11/22]
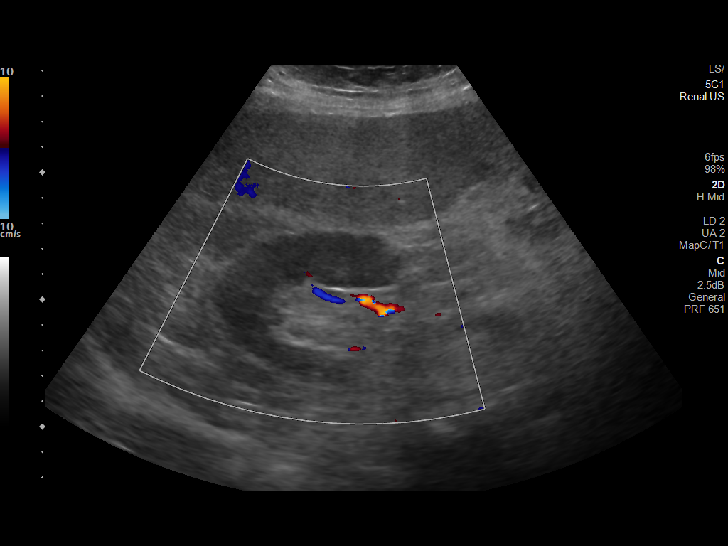
[im 12/22]
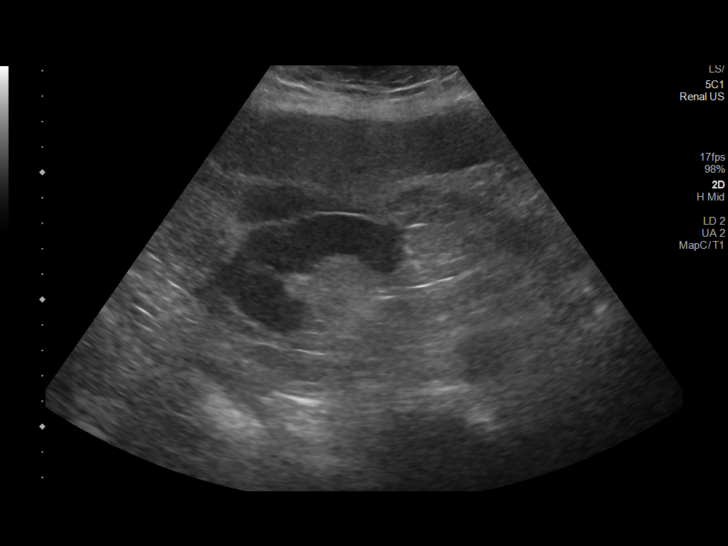
[im 14/22]
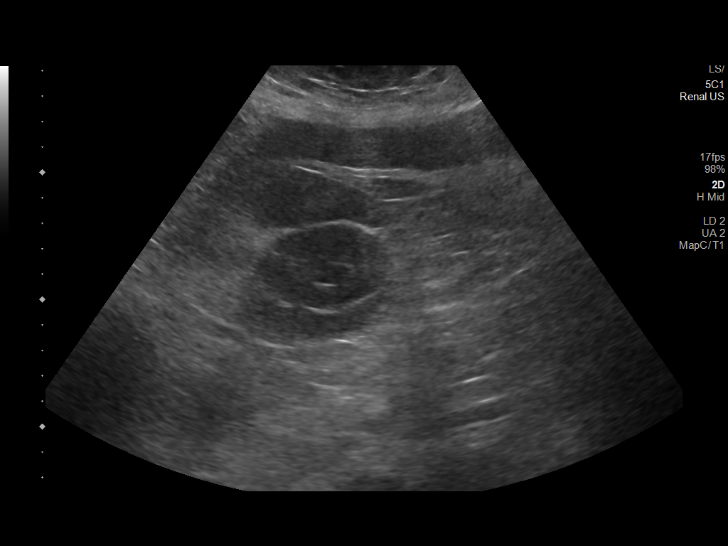
[im 15/22]
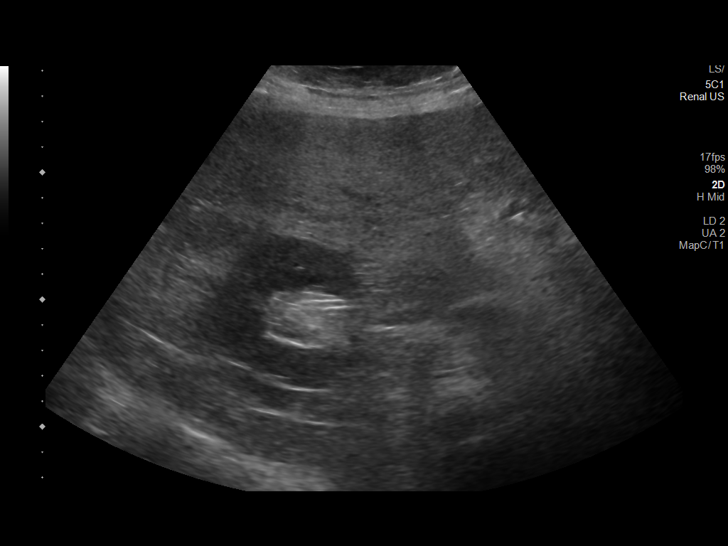
[im 17/22]
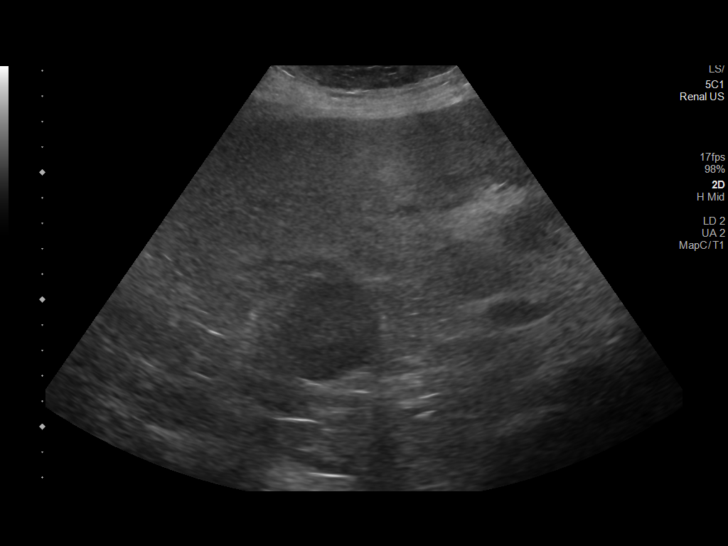
[im 19/22]
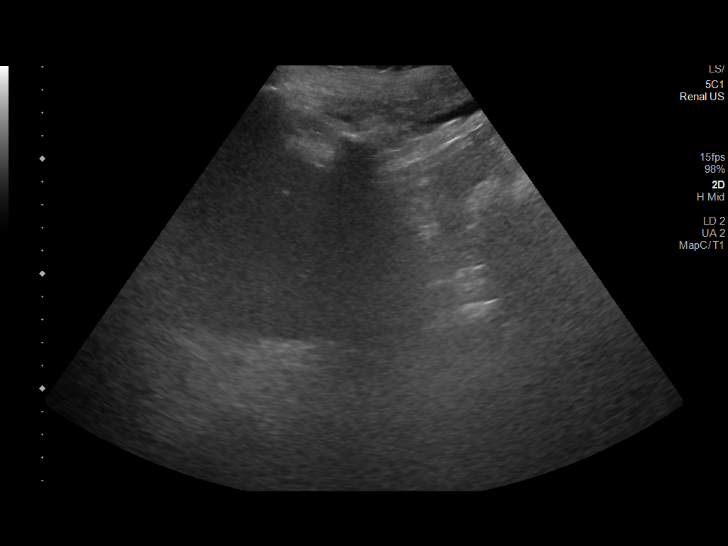
[im 20/22]
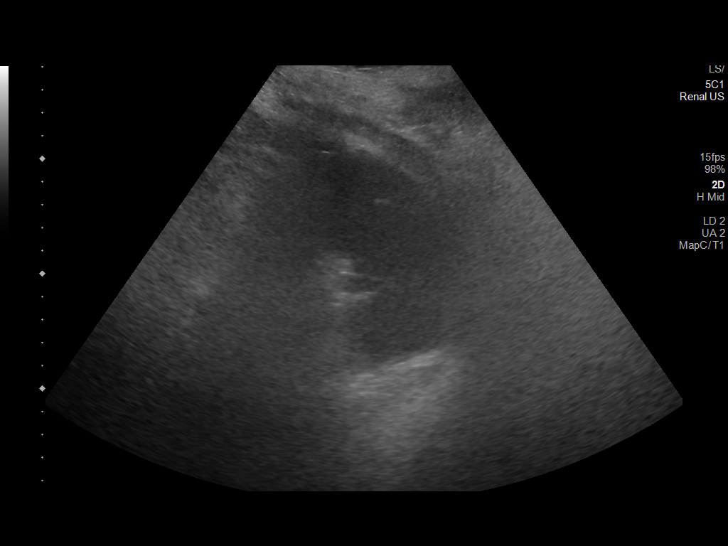
[im 22/22]
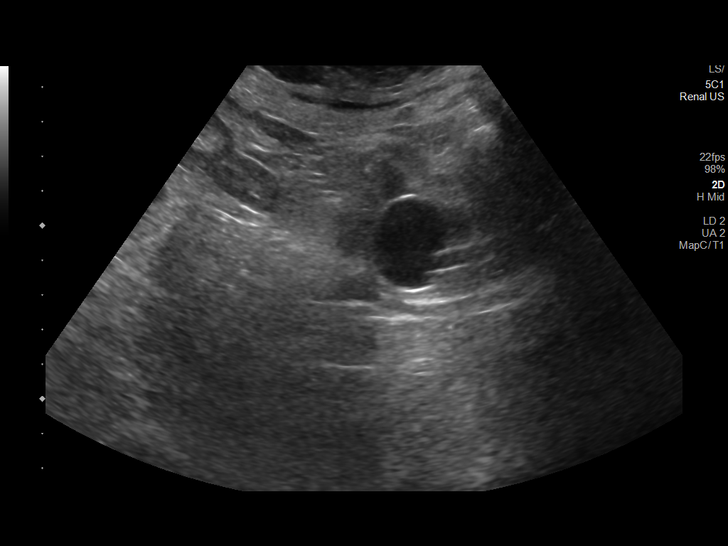

[14 of 22 positions shown; findings below may reference images not displayed]

FINDINGS: Right Kidney:

Renal measurements: 13 x 6.2 x 5.9 cm = volume: 249 mL. Echogenicity
within normal limits. No mass or hydronephrosis visualized.

Left Kidney:

Surgically absent.

Bladder:

Decompressed with Foley.

Other:

None.
IMPRESSION: No acute finding in the right kidney. Surgically absent left kidney.

## 2020-10-18 IMAGING — DX DG ABD PORTABLE 1V
2 series · 2 of 2 positions shown · non-contrast
Comparison: [DATE]

CLINICAL DATA: Constipation.

EXAM:
PORTABLE ABDOMEN - 1 VIEW

[abdomen kub (1 of 2)]
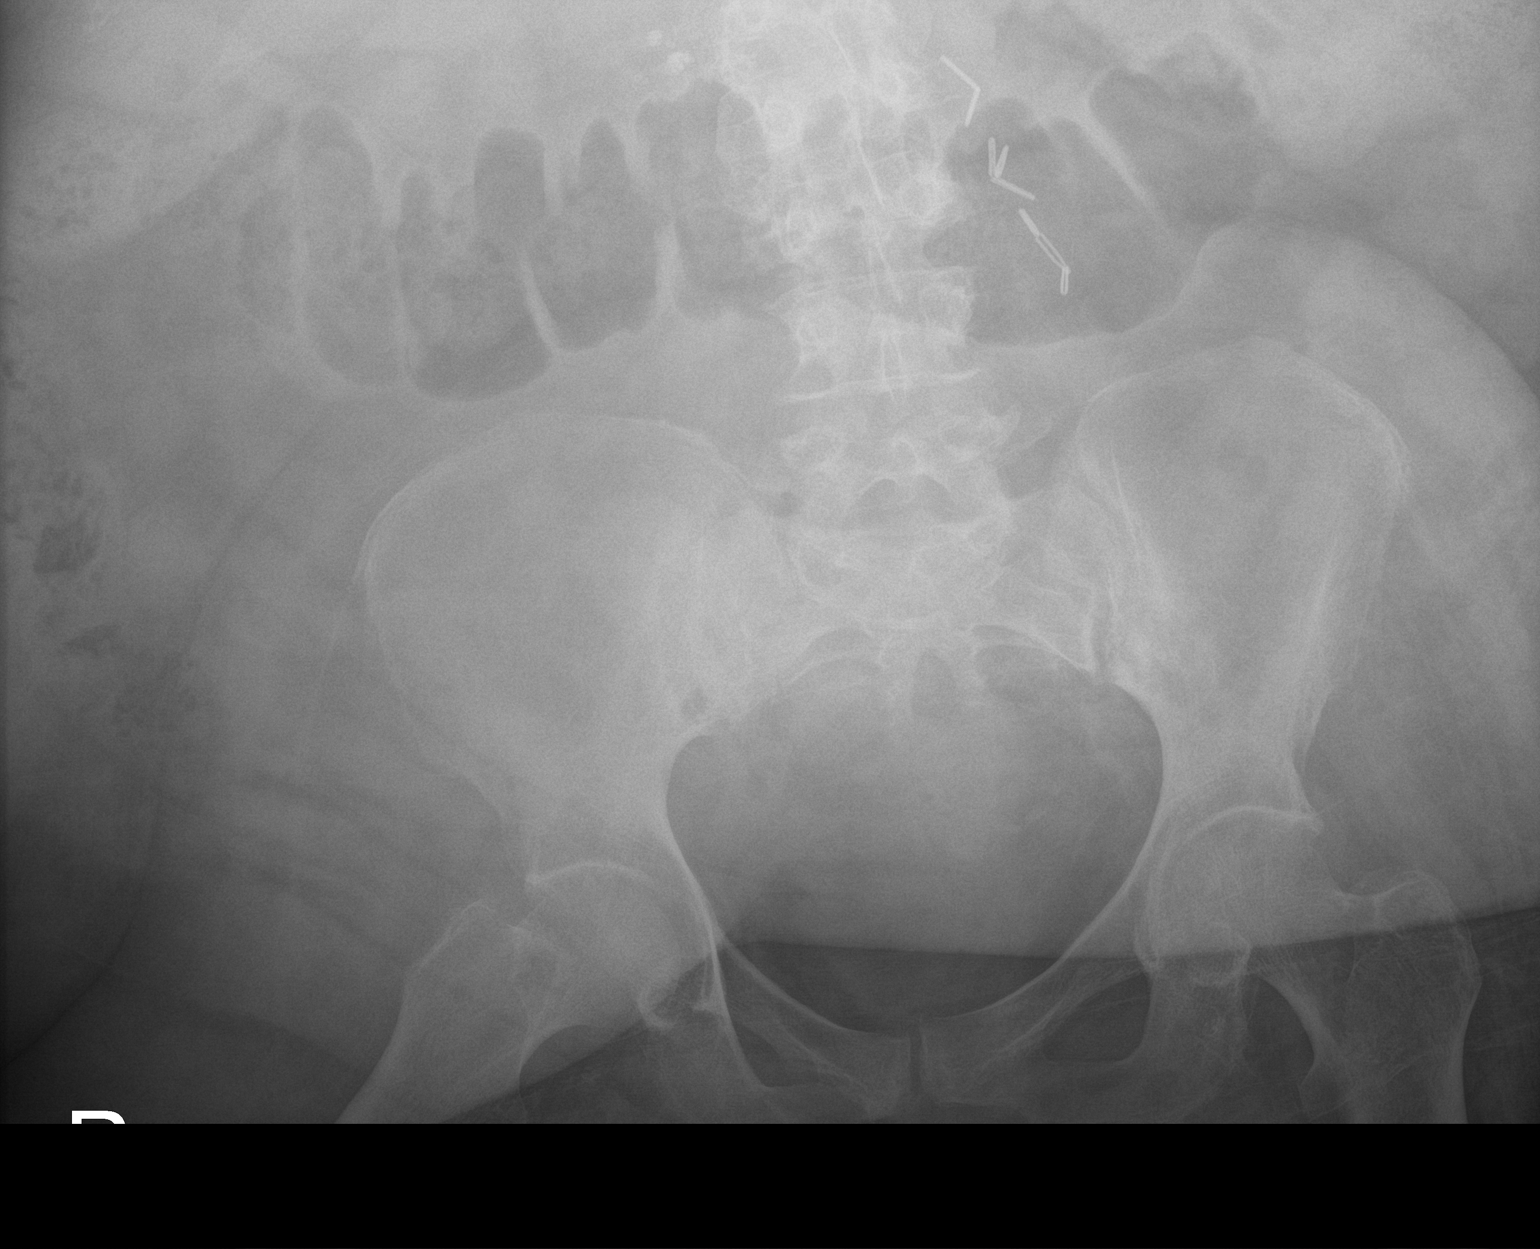

[abdomen kub (2 of 2)]
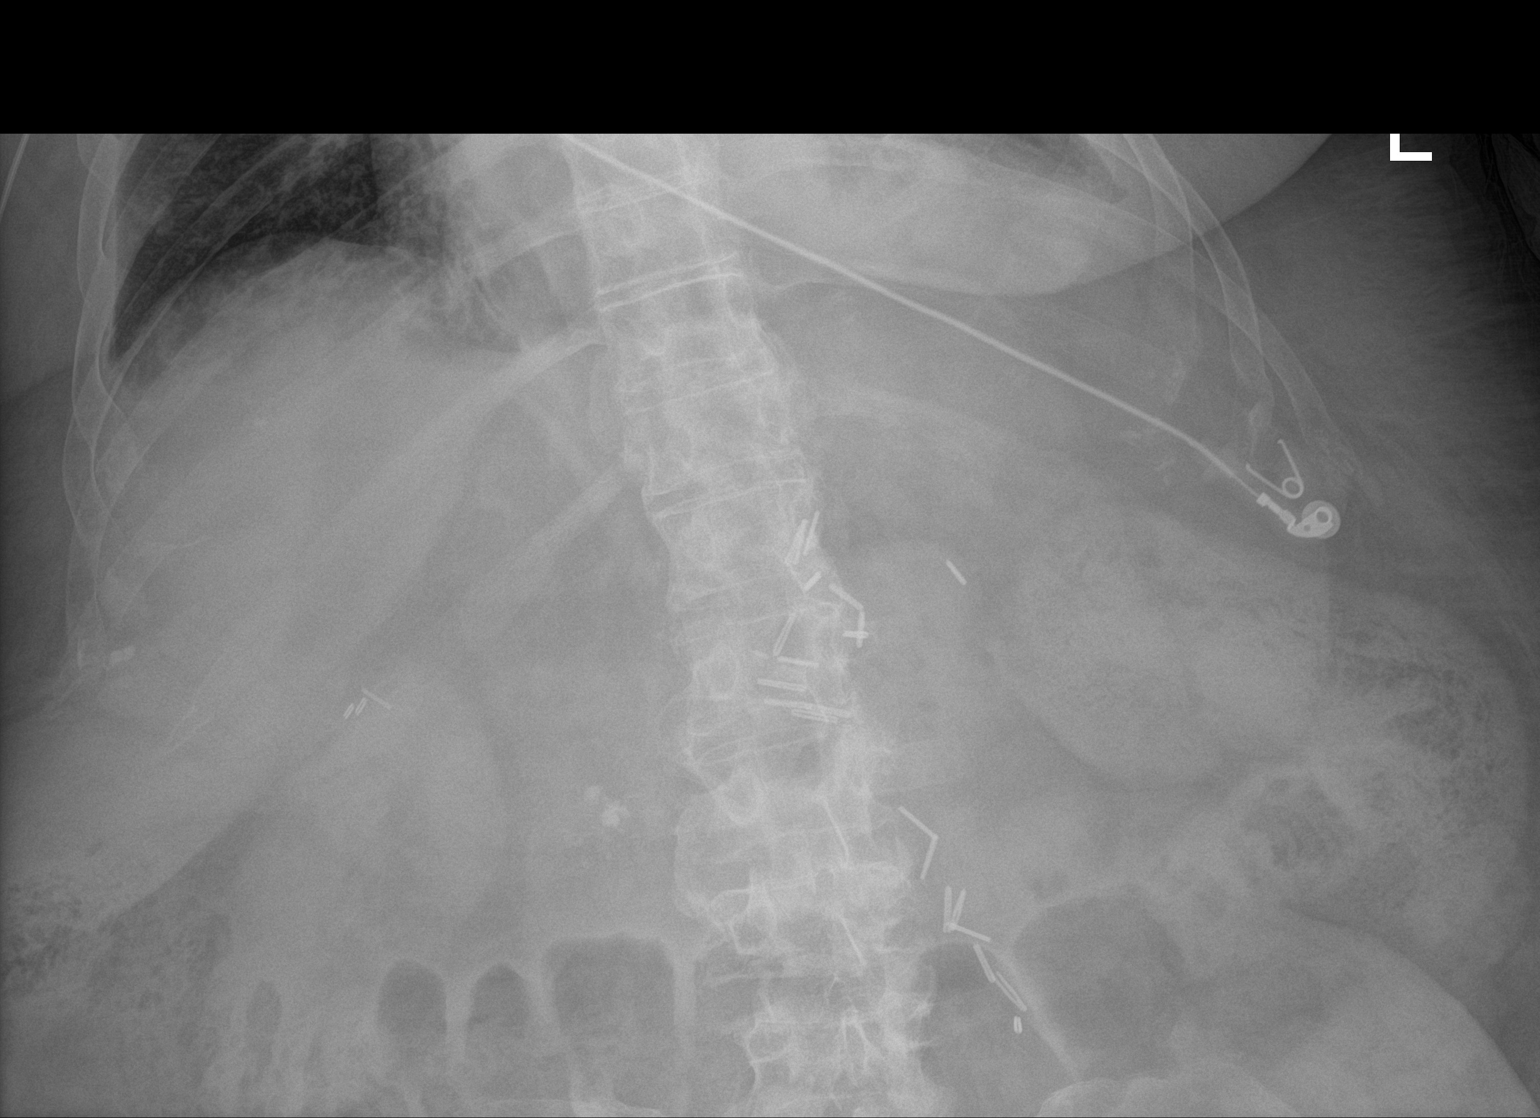

[2 of 2 positions shown; findings below may reference images not displayed]

FINDINGS: Scattered air and stool in the colon but no significant stool burden
or findings for fecal impaction. No distended small bowel loops to
suggest obstruction. The soft tissue shadows of the abdomen are
grossly maintained. Stable surgical changes. The bony structures are
unremarkable.
IMPRESSION: Unremarkable abdominal radiograph.

## 2020-10-18 IMAGING — DX DG CHEST 1V PORT
1 series · 2 of 2 positions shown · non-contrast
Comparison: [DATE]

CLINICAL DATA: PICC placement

EXAM:
PORTABLE CHEST 1 VIEW

[Series 1: chest · 0.14mm/px · 2 of 2 slices shown]
[im 1/2]
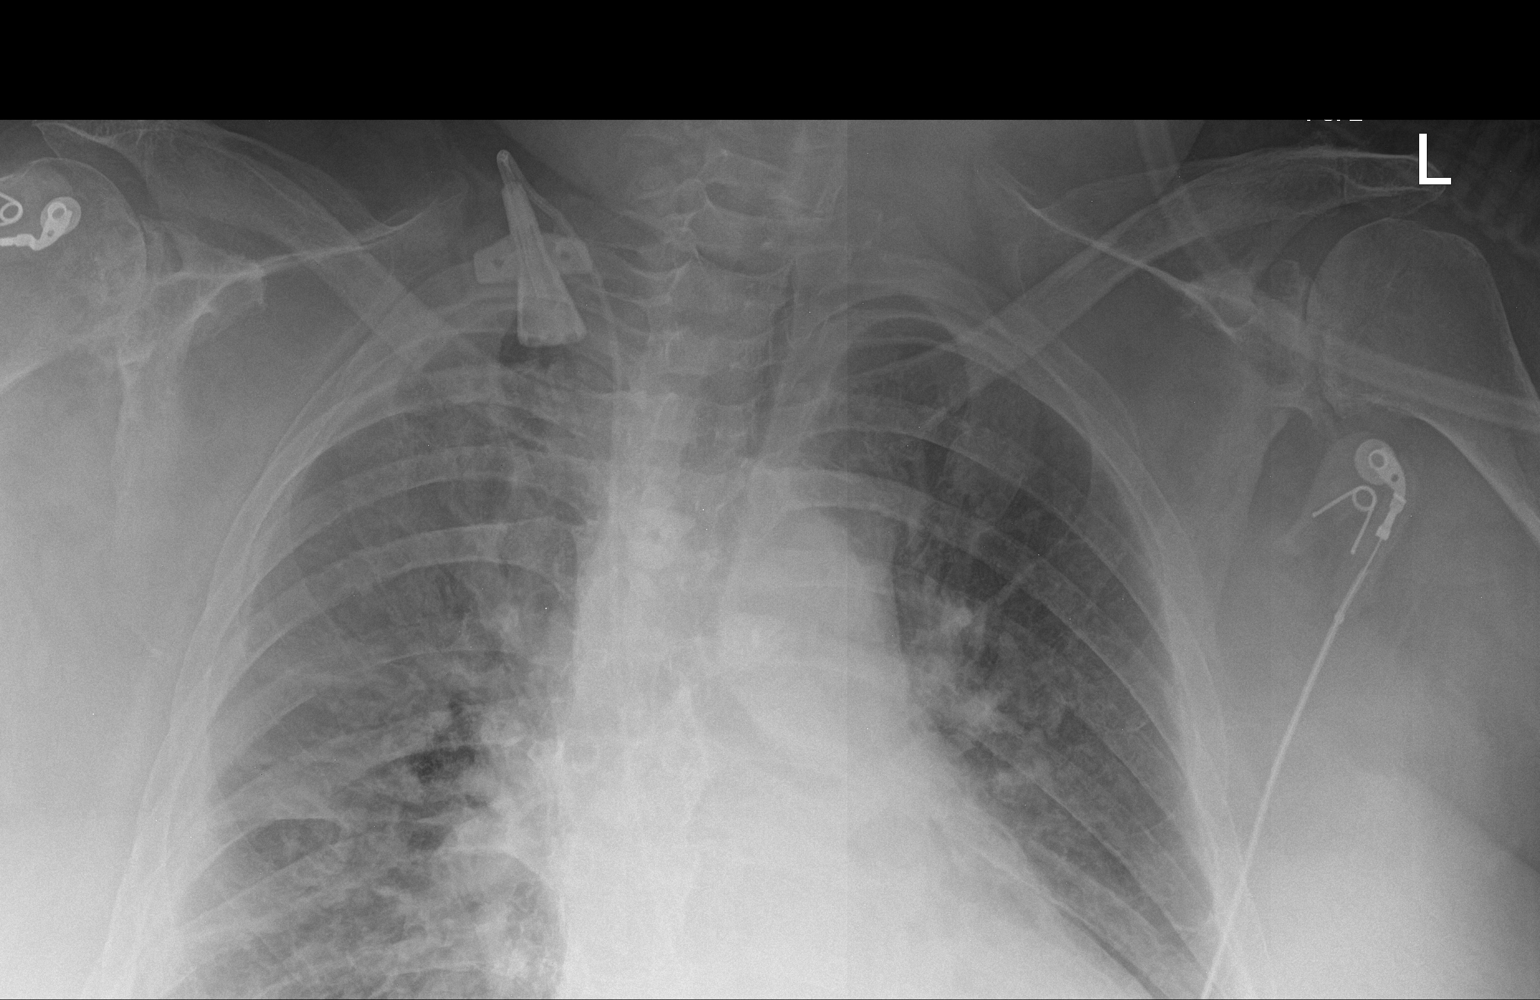
[im 2/2]
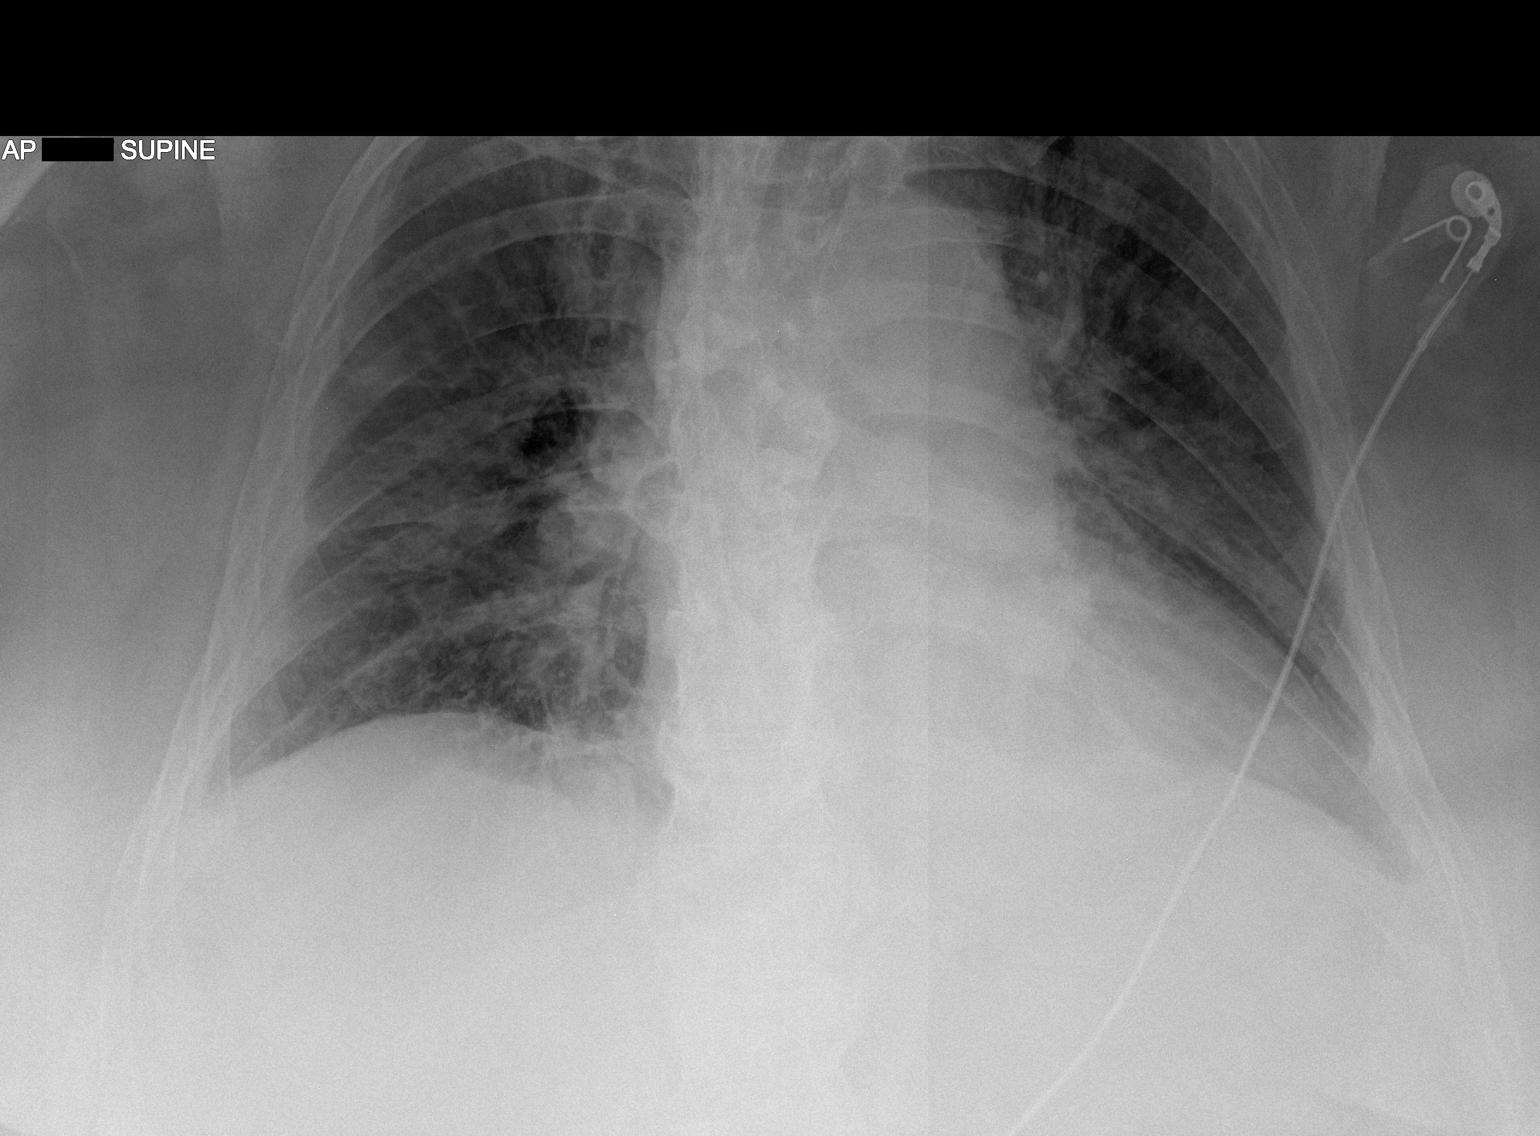

[2 of 2 positions shown; findings below may reference images not displayed]

FINDINGS: Two frontal views of the chest demonstrate a right-sided PICC via
jugular approach tip overlying the atriocaval junction cardiac
silhouette is stable. There is central vascular congestion with
bilateral ground-glass airspace disease and trace bilateral
effusions unchanged. No pneumothorax.
IMPRESSION: 1. Findings consistent with mild congestive heart failure or
bilateral pneumonia.
2. No complication after right-sided PICC placement.

## 2020-10-18 IMAGING — DX DG CHEST 1V PORT
1 series · 1 of 1 positions shown · non-contrast
Comparison: Two days ago

CLINICAL DATA: Pneumonia

EXAM:
PORTABLE CHEST 1 VIEW

[chest ap]
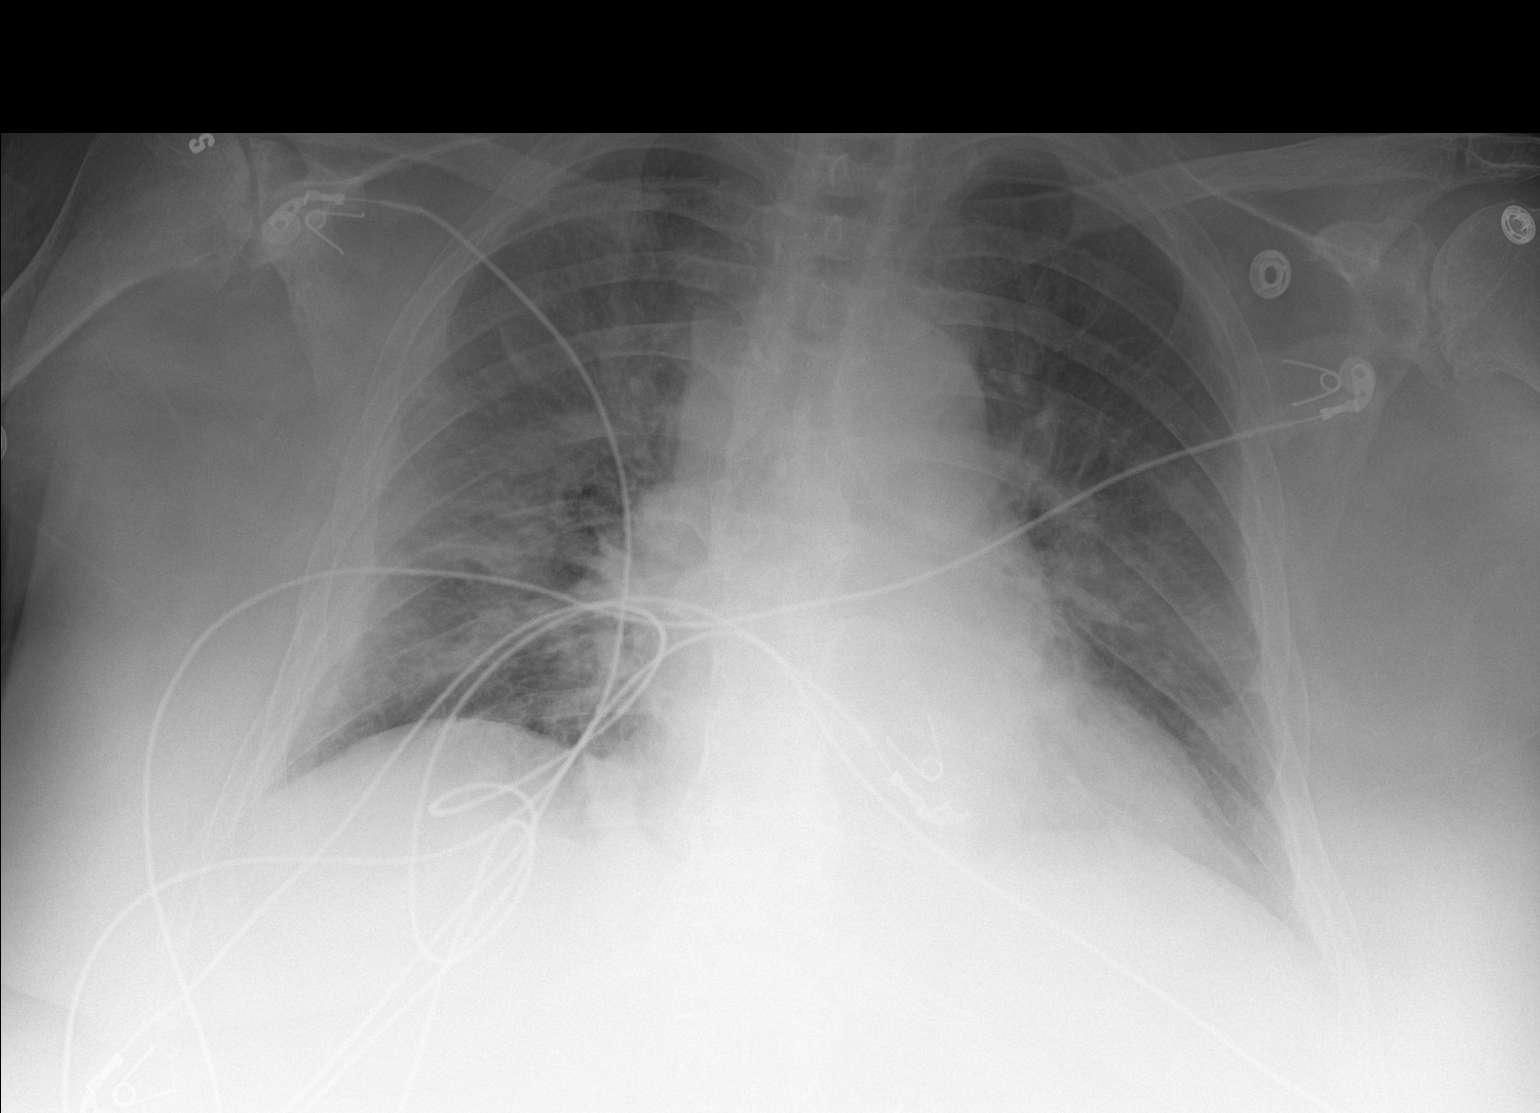

[1 of 1 positions shown; findings below may reference images not displayed]

FINDINGS: Unchanged hazy pulmonary opacity on the right more than left. Stable
cardiomegaly and mediastinal contours. No effusion or air leak.
Artifact from EKG leads. Advanced glenohumeral osteoarthritis.
IMPRESSION: Stable bilateral pulmonary infiltrates.

## 2020-10-18 IMAGING — DX DG ABD PORTABLE 1V
2 series · 2 of 2 positions shown · non-contrast
Comparison: [DATE] at [2L] hours

CLINICAL DATA: NG tube placement

EXAM:
PORTABLE ABDOMEN - 1 VIEW

[abdomen supine (1 of 2)]
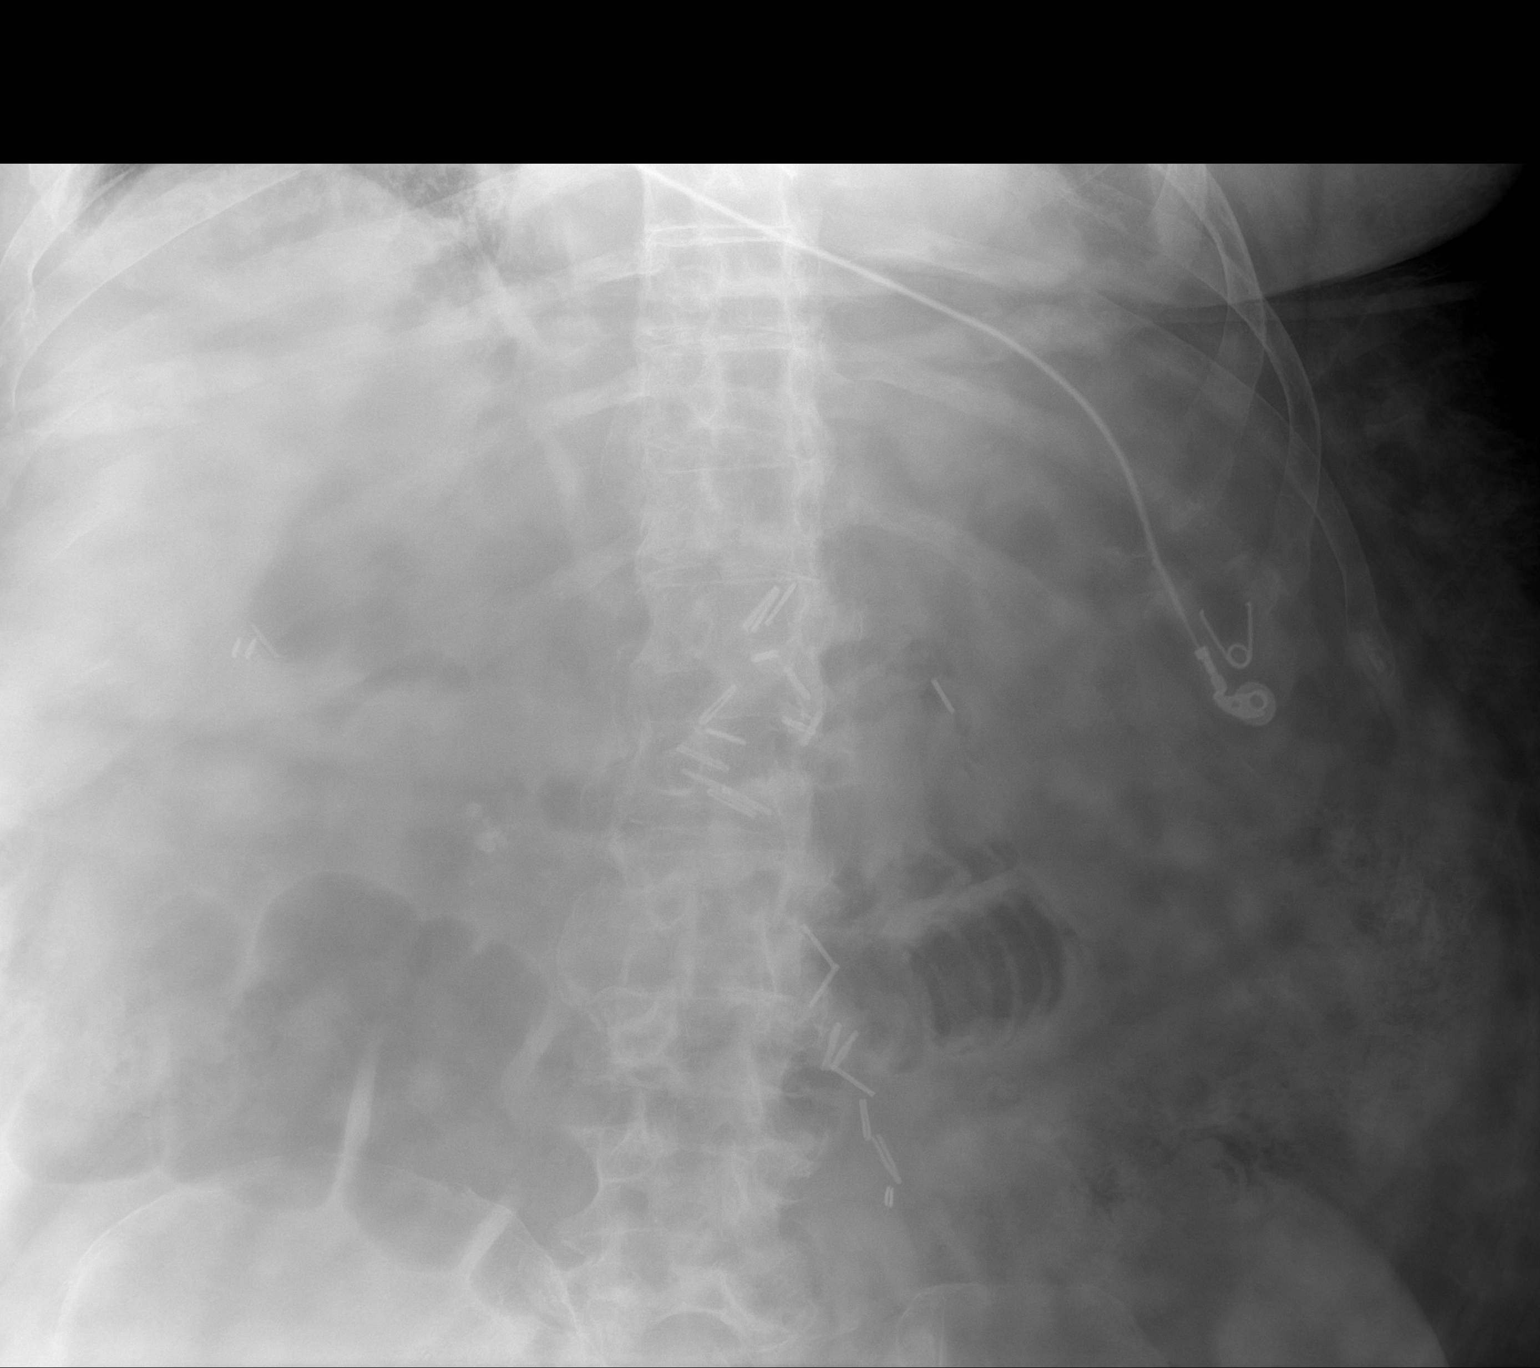

[abdomen supine (2 of 2)]
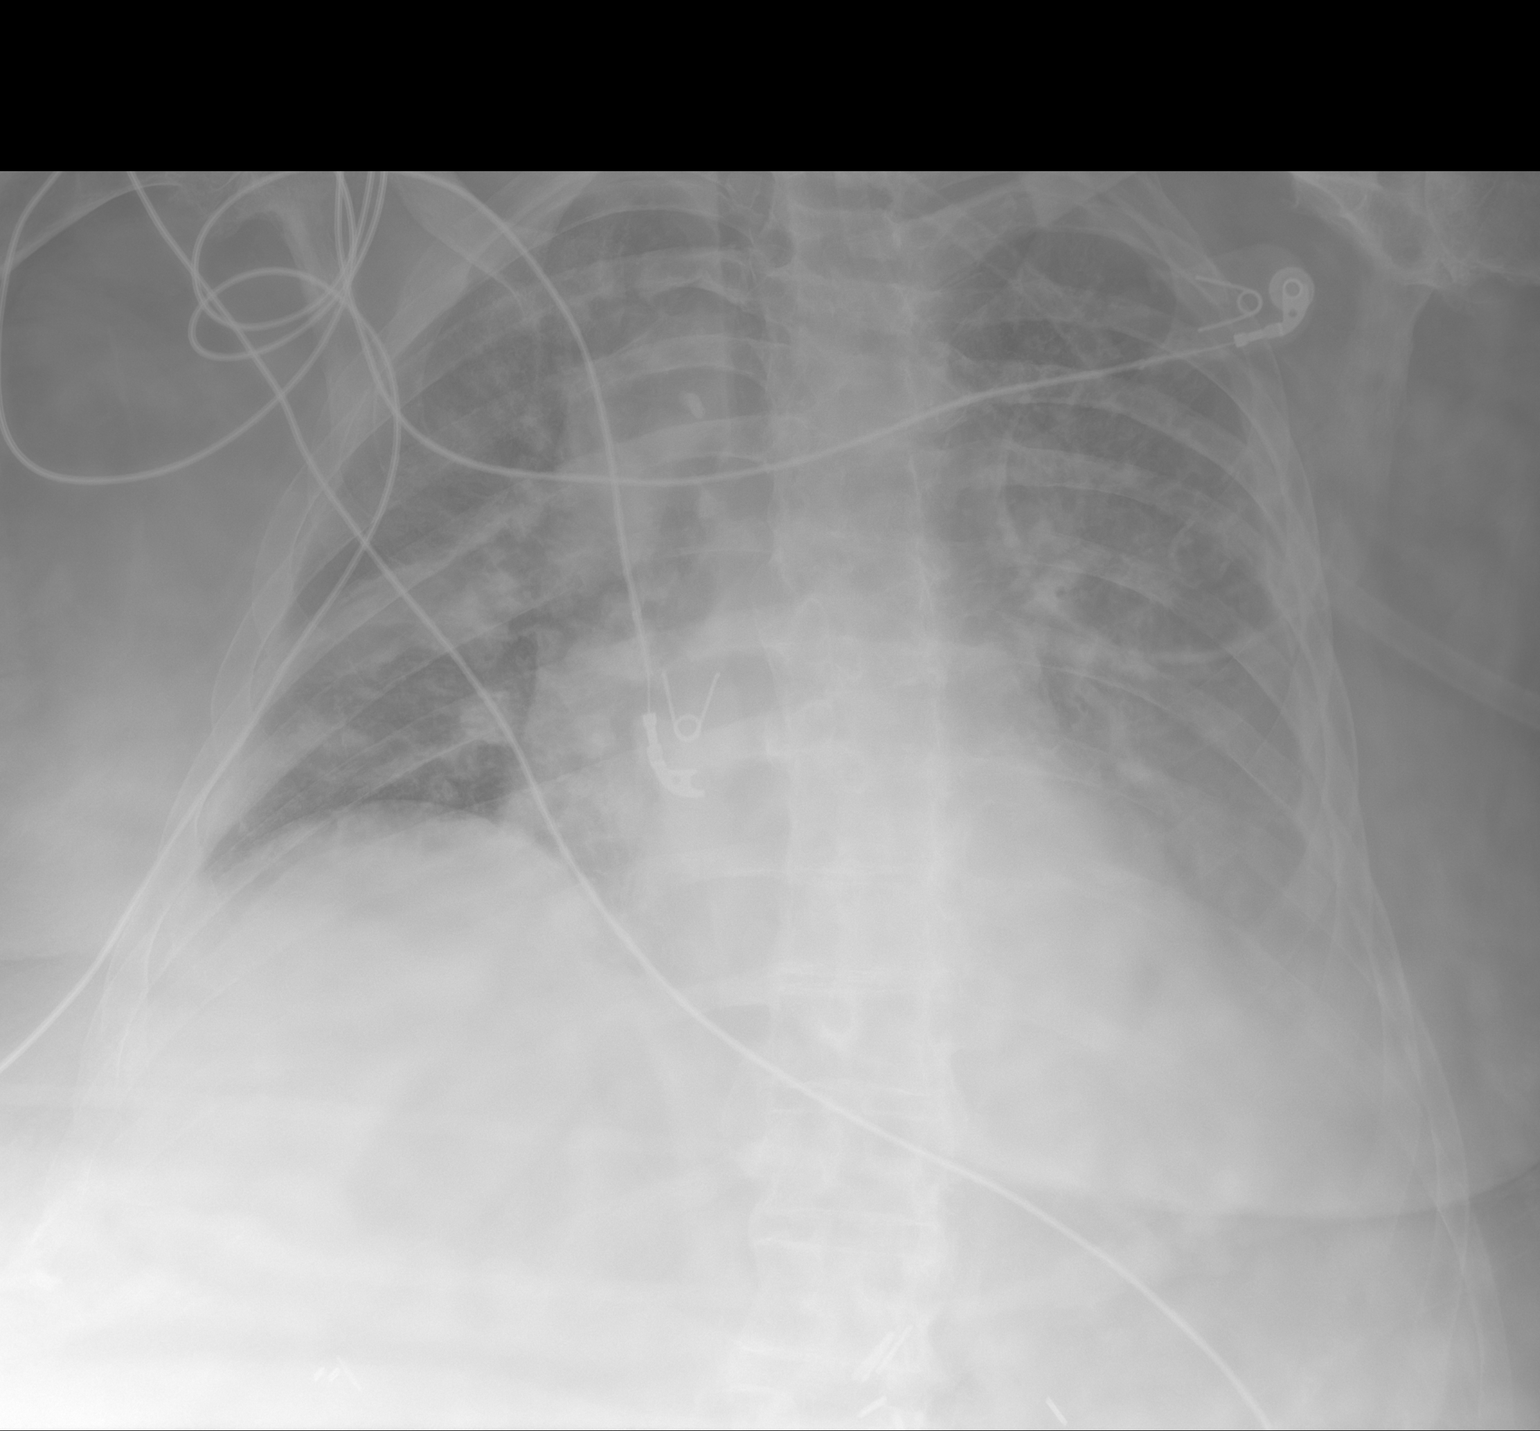

[2 of 2 positions shown; findings below may reference images not displayed]

FINDINGS: Limited radiograph of the lower chest and upper abdomen was obtained
for the purposes of enteric tube localization. No enteric tube is
seen projecting over the chest or abdomen. Borderline dilated loops
of small bowel within the mid abdomen measuring up to 3.2 cm in
diameter. Diffuse bilateral airspace opacities.
IMPRESSION: 1. No enteric tube is seen projecting over the chest or abdomen.
2. Borderline dilated loops of small bowel within the mid abdomen
measuring up to 3.2 cm in diameter. Findings could represent
enteritis, ileus, or developing obstruction.

## 2020-10-19 LAB — BLOOD GAS, ARTERIAL
Acid-base deficit: 5.1 mmol/L — ABNORMAL HIGH (ref 0.0–2.0)
Acid-base deficit: 9.3 mmol/L — ABNORMAL HIGH (ref 0.0–2.0)
Bicarbonate: 23.1 mmol/L (ref 20.0–28.0)
Bicarbonate: 18.7 mmol/L — ABNORMAL LOW (ref 20.0–28.0)
FIO2: 100
FIO2: 100
O2 Saturation: 81.5 %
O2 Saturation: 80.7 %
Patient temperature: 36.3
Patient temperature: 37
pCO2 arterial: 71.9 mmHg (ref 32.0–48.0)
pCO2 arterial: 61.4 mmHg — ABNORMAL HIGH (ref 32.0–48.0)
pH, Arterial: 7.128 — CL (ref 7.350–7.450)
pH, Arterial: 7.111 — CL (ref 7.350–7.450)
pO2, Arterial: 57.6 mmHg — ABNORMAL LOW (ref 83.0–108.0)
pO2, Arterial: 59.1 mmHg — ABNORMAL LOW (ref 83.0–108.0)

## 2020-10-19 LAB — CBC
HCT: 36.6 % (ref 36.0–46.0)
Hemoglobin: 9.5 g/dL — ABNORMAL LOW (ref 12.0–15.0)
MCH: 23.8 pg — ABNORMAL LOW (ref 26.0–34.0)
MCHC: 26 g/dL — ABNORMAL LOW (ref 30.0–36.0)
MCV: 91.7 fL (ref 80.0–100.0)
Platelets: 325 10*3/uL (ref 150–400)
RBC: 3.99 MIL/uL (ref 3.87–5.11)
RDW: 19.8 % — ABNORMAL HIGH (ref 11.5–15.5)
WBC: 33.3 10*3/uL — ABNORMAL HIGH (ref 4.0–10.5)
nRBC: 0.7 % — ABNORMAL HIGH (ref 0.0–0.2)

## 2020-10-19 LAB — RENAL FUNCTION PANEL
Albumin: 1.5 g/dL — ABNORMAL LOW (ref 3.5–5.0)
Anion gap: 11 (ref 5–15)
BUN: 48 mg/dL — ABNORMAL HIGH (ref 8–23)
CO2: 22 mmol/L (ref 22–32)
Calcium: 7.6 mg/dL — ABNORMAL LOW (ref 8.9–10.3)
Chloride: 100 mmol/L (ref 98–111)
Creatinine, Ser: 2.84 mg/dL — ABNORMAL HIGH (ref 0.44–1.00)
GFR, Estimated: 18 mL/min — ABNORMAL LOW (ref >60)
Glucose, Bld: 287 mg/dL — ABNORMAL HIGH (ref 70–99)
Phosphorus: 7.4 mg/dL — ABNORMAL HIGH (ref 2.5–4.6)
Potassium: 5.4 mmol/L — ABNORMAL HIGH (ref 3.5–5.1)
Sodium: 133 mmol/L — ABNORMAL LOW (ref 135–145)

## 2020-10-19 LAB — MAGNESIUM: Magnesium: 2.9 mg/dL — ABNORMAL HIGH (ref 1.7–2.4)

## 2020-10-19 LAB — LACTIC ACID, PLASMA
Lactic Acid, Venous: 4.5 mmol/L (ref 0.5–1.9)
Lactic Acid, Venous: 5.7 mmol/L (ref 0.5–1.9)

## 2020-10-20 ENCOUNTER — Other Ambulatory Visit (HOSPITAL_COMMUNITY): Payer: Medicare Other

## 2020-10-20 LAB — CBC
HCT: 36.5 % (ref 36.0–46.0)
Hemoglobin: 9.2 g/dL — ABNORMAL LOW (ref 12.0–15.0)
MCH: 23.9 pg — ABNORMAL LOW (ref 26.0–34.0)
MCHC: 25.2 g/dL — ABNORMAL LOW (ref 30.0–36.0)
MCV: 94.8 fL (ref 80.0–100.0)
Platelets: 269 10*3/uL (ref 150–400)
RBC: 3.85 MIL/uL — ABNORMAL LOW (ref 3.87–5.11)
RDW: 19.9 % — ABNORMAL HIGH (ref 11.5–15.5)
WBC: 39.2 10*3/uL — ABNORMAL HIGH (ref 4.0–10.5)
nRBC: 1.5 % — ABNORMAL HIGH (ref 0.0–0.2)

## 2020-10-20 LAB — RENAL FUNCTION PANEL
Albumin: <1.5 g/dL — ABNORMAL LOW (ref 3.5–5.0)
Anion gap: 19 — ABNORMAL HIGH (ref 5–15)
BUN: 54 mg/dL — ABNORMAL HIGH (ref 8–23)
CO2: 14 mmol/L — ABNORMAL LOW (ref 22–32)
Calcium: 7.6 mg/dL — ABNORMAL LOW (ref 8.9–10.3)
Chloride: 96 mmol/L — ABNORMAL LOW (ref 98–111)
Creatinine, Ser: 3.22 mg/dL — ABNORMAL HIGH (ref 0.44–1.00)
GFR, Estimated: 16 mL/min — ABNORMAL LOW (ref >60)
Glucose, Bld: 210 mg/dL — ABNORMAL HIGH (ref 70–99)
Phosphorus: 8.2 mg/dL — ABNORMAL HIGH (ref 2.5–4.6)
Potassium: 5.5 mmol/L — ABNORMAL HIGH (ref 3.5–5.1)
Sodium: 129 mmol/L — ABNORMAL LOW (ref 135–145)

## 2020-10-20 LAB — LACTIC ACID, PLASMA: Lactic Acid, Venous: >9 mmol/L (ref 0.5–1.9)

## 2020-10-20 LAB — URINE CULTURE: Culture: 80000 — AB

## 2020-10-20 LAB — MAGNESIUM: Magnesium: 2.9 mg/dL — ABNORMAL HIGH (ref 1.7–2.4)

## 2020-10-20 IMAGING — DX DG ABDOMEN 1V
1 series · 1 of 1 positions shown · non-contrast
Comparison: None.

CLINICAL DATA: NG tube placement

EXAM:
ABDOMEN - 1 VIEW

[abdomen]
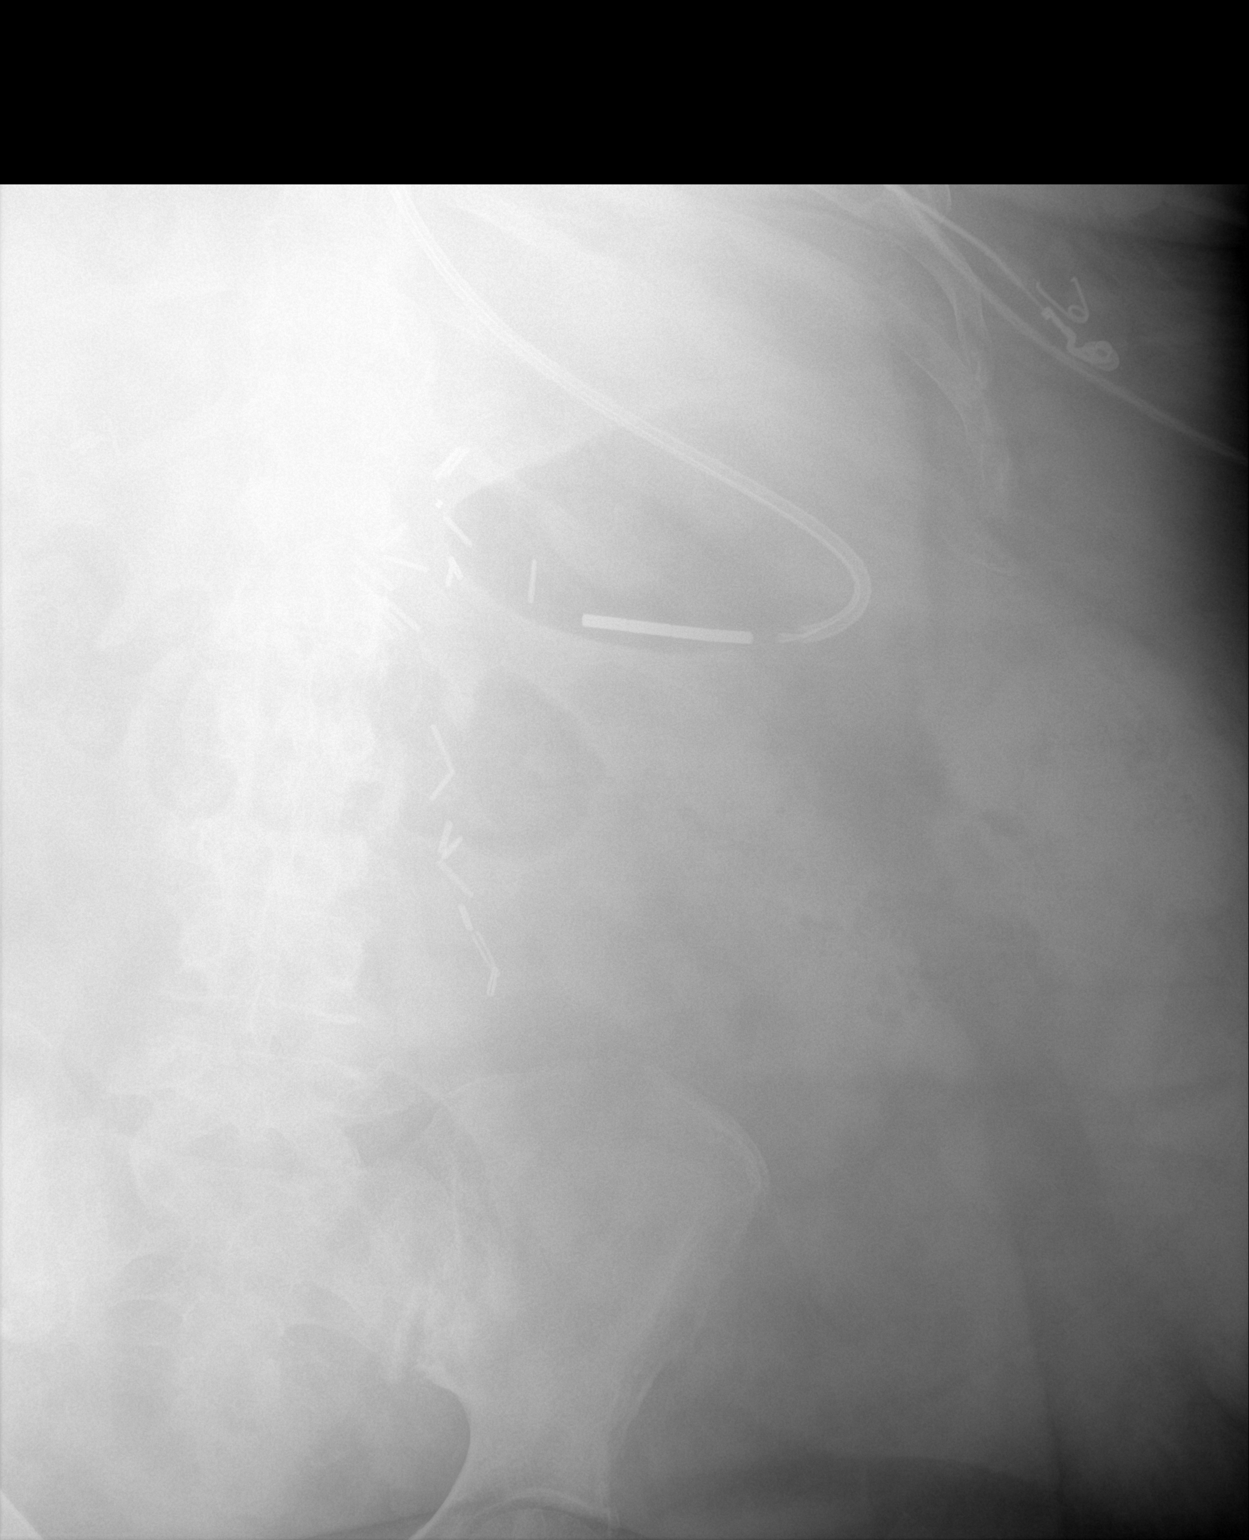

[1 of 1 positions shown; findings below may reference images not displayed]

FINDINGS: Weighted enteric feeding tube is positioned with tip below the
diaphragm, within the gastric body. Metallic stylette remains in
position. Nonobstructive pattern of included bowel gas.
IMPRESSION: Weighted enteric feeding tube is positioned with tip below the
diaphragm, within the gastric body. Metallic stylette remains in
position.

## 2020-10-24 LAB — CULTURE, BLOOD (ROUTINE X 2)
Culture: NO GROWTH
Culture: NO GROWTH
Special Requests: ADEQUATE
Special Requests: ADEQUATE

## 2020-11-06 DEATH — deceased
